# Patient Record
Sex: Male | Born: 1940 | Race: White | Hispanic: No | Marital: Married | Smoking: Never smoker
Health system: Southern US, Community
[De-identification: ages and names within clinical notes are randomized; demographics above are authoritative.]

## PROBLEM LIST (undated history)

## (undated) DIAGNOSIS — N400 Enlarged prostate without lower urinary tract symptoms: Secondary | ICD-10-CM

## (undated) DIAGNOSIS — R0789 Other chest pain: Secondary | ICD-10-CM

## (undated) DIAGNOSIS — I712 Thoracic aortic aneurysm, without rupture: Secondary | ICD-10-CM

## (undated) DIAGNOSIS — E7800 Pure hypercholesterolemia, unspecified: Secondary | ICD-10-CM

## (undated) DIAGNOSIS — I351 Nonrheumatic aortic (valve) insufficiency: Secondary | ICD-10-CM

## (undated) DIAGNOSIS — I10 Essential (primary) hypertension: Secondary | ICD-10-CM

## (undated) DIAGNOSIS — A809 Acute poliomyelitis, unspecified: Secondary | ICD-10-CM

## (undated) HISTORY — DX: Essential (primary) hypertension: I10

## (undated) HISTORY — DX: Acute poliomyelitis, unspecified: A80.9

## (undated) HISTORY — DX: Benign prostatic hyperplasia without lower urinary tract symptoms: N40.0

## (undated) HISTORY — PX: ADENOIDECTOMY: SUR15

## (undated) HISTORY — PX: CATARACT EXTRACTION, BILATERAL: SHX1313

## (undated) HISTORY — DX: Pure hypercholesterolemia, unspecified: E78.00

## (undated) HISTORY — DX: Nonrheumatic aortic (valve) insufficiency: I35.1

## (undated) HISTORY — DX: Thoracic aortic aneurysm, without rupture: I71.2

## (undated) HISTORY — DX: Other chest pain: R07.89

## (undated) HISTORY — PX: TONSILLECTOMY: SUR1361

---

## 1998-12-08 ENCOUNTER — Ambulatory Visit (HOSPITAL_COMMUNITY): Admission: RE | Admit: 1998-12-08 | Discharge: 1998-12-08 | Payer: Self-pay | Admitting: Gastroenterology

## 2002-08-06 ENCOUNTER — Ambulatory Visit (HOSPITAL_BASED_OUTPATIENT_CLINIC_OR_DEPARTMENT_OTHER): Admission: RE | Admit: 2002-08-06 | Discharge: 2002-08-06 | Payer: Self-pay | Admitting: Plastic Surgery

## 2004-08-24 ENCOUNTER — Ambulatory Visit (HOSPITAL_COMMUNITY): Admission: RE | Admit: 2004-08-24 | Discharge: 2004-08-24 | Payer: Self-pay | Admitting: Gastroenterology

## 2010-11-02 ENCOUNTER — Other Ambulatory Visit: Payer: Self-pay | Admitting: *Deleted

## 2010-11-02 NOTE — Telephone Encounter (Signed)
[place]

## 2010-11-03 MED ORDER — CIPROFLOXACIN HCL 500 MG PO TABS: 500.0000 mg | ORAL_TABLET | Freq: Two times a day (BID) | ORAL | Status: AC

## 2010-11-03 MED ORDER — DIPHENOXYLATE-ATROPINE 2.5-0.025 MG PO TABS: 1.0000 | ORAL_TABLET | Freq: Four times a day (QID) | ORAL | Status: AC | PRN

## 2011-09-20 ENCOUNTER — Encounter: Payer: Self-pay | Admitting: *Deleted

## 2012-02-09 ENCOUNTER — Other Ambulatory Visit: Payer: Self-pay | Admitting: Dermatology

## 2012-02-20 ENCOUNTER — Other Ambulatory Visit: Payer: Self-pay | Admitting: *Deleted

## 2012-02-20 MED ORDER — SILDENAFIL CITRATE 100 MG PO TABS
100.0000 mg | ORAL_TABLET | Freq: Every day | ORAL | Status: DC | PRN
Start: 2012-02-20 — End: 2012-11-05

## 2012-02-20 NOTE — Telephone Encounter (Signed)
Refilled viagra

## 2012-07-30 ENCOUNTER — Other Ambulatory Visit: Payer: Self-pay

## 2012-10-31 ENCOUNTER — Ambulatory Visit (INDEPENDENT_AMBULATORY_CARE_PROVIDER_SITE_OTHER): Payer: Commercial Managed Care - PPO | Admitting: Surgery

## 2012-10-31 ENCOUNTER — Encounter (INDEPENDENT_AMBULATORY_CARE_PROVIDER_SITE_OTHER): Payer: Self-pay | Admitting: Surgery

## 2012-10-31 VITALS — BP 136/90 | HR 69 | Temp 97.0°F | Resp 16 | Ht 69.25 in | Wt 165.6 lb

## 2012-10-31 DIAGNOSIS — K4090 Unilateral inguinal hernia, without obstruction or gangrene, not specified as recurrent: Secondary | ICD-10-CM

## 2012-10-31 NOTE — Progress Notes (Addendum)
Re:   Tommy Hubbard DOB:   March 14, 1941 MRN:   [id number]  ASSESSMENT AND PLAN: 1.  Right inguinal hernia, small  I discussed the indications and complications of hernia surgery with the patient.  I discussed both the laparoscopic and open approach to hernia repair..  The potential risks of hernia surgery include, but are not limited to, bleeding, infection, open surgery, nerve injury, and recurrence of the hernia.  I provided the patient literature about hernia surgery.  Because he is thin, has a minimal inguinal hernia, does have some minimal prostate problems, I think that Dr. Patty Sermons would be a good candidate for open repair.  He is comfortable with this and will call back if her changes his mind.  2.  Otherwise healthy  Chief Complaint  Patient presents with  . New Evaluation    eval hernia   REFERRING PHYSICIAN:  Self referred.  HISTORY OF PRESENT ILLNESS: Tommy Hubbard is a 72 y.o. (DOB: February 07, 1941)  white  male whose primary care physician is PERINI,MARK A, MD and comes to me today for possible right inguinal hernia.  He was at a echo course at the [facility], in [place], when he felt a pain in his right groin.  He also noticed a bulge in his right groin. He has had some pain towards his right testicle. The hernia has been easily reducible. He had a colonoscopy last year by Dr. Nadine Counts Buccini which was negative. He has no history of stomach, liver, or colon disease. He's had no prior abdominal surgery.  He has had no prior hernia.    Past Medical History  Diagnosis Date  . Polio     age 72  . BPH (benign prostatic hyperplasia)      History reviewed. No pertinent past surgical history.    Current Outpatient Prescriptions  Medication Sig Dispense Refill  . aspirin 81 MG tablet Take 81 mg by mouth daily.      . Calcium Citrate-Vitamin D 500-500 MG-UNT/5GM POWD Take 500 mg by mouth daily.      . cholecalciferol (VITAMIN D) 400 UNITS TABS Take 1,000 Units by mouth 2  (two) times daily.      . Multiple Vitamin (MULTIVITAMIN) tablet Take 1 tablet by mouth daily.      . vitamin B-12 (CYANOCOBALAMIN) 100 MCG tablet Take 50 mcg by mouth daily.      . sildenafil (VIAGRA) 100 MG tablet Take 1 tablet (100 mg total) by mouth daily as needed for erectile dysfunction.  10 tablet  6   No current facility-administered medications for this visit.     No Known Allergies  REVIEW OF SYSTEMS: Skin:  No history of rash.  No history of abnormal moles. Infection:  No history of hepatitis or HIV.  No history of MRSA. Neurologic:  No history of stroke.  No history of seizure.  Polio at age 55, no residual. Cardiac:  No history of hypertension. No history of heart disease.   Pulmonary:  Does not smoke cigarettes.  No asthma or bronchitis.  No OSA/CPAP.  Endocrine:  No diabetes. No thyroid disease.  His TSH runs a little high, but he is not on replacement. Gastrointestinal:  No history of stomach disease.  No history of liver disease.  No history of gall bladder disease.  No history of pancreas disease.  Colonoscopy by Dr. Clent Ridges - 2013. Urologic:  Mild BPH - on no meds.  Sees Dr. Monico Blitz. Musculoskeletal:  No history of joint or back  disease. Hematologic:  No bleeding disorder.  No history of anemia. Takes a baby aspirin a day. Psycho-social:  The patient is oriented.   The patient has no obvious psychologic or social impairment to understanding our conversation and plan.  SOCIAL and FAMILY HISTORY: Married. His mother lived to be 72 and his father lived to 36. Has 5 grandchildren, ages 69 to 27.  PHYSICAL EXAM: BP 136/90  Pulse 69  Temp(Src) 97 F (36.1 C) (Temporal)  Resp 16  Ht 5' 9.25" (1.759 m)  Wt 165 lb 9.6 oz (75.116 kg)  BMI 24.28 kg/m2  SpO2 97%  General: WN WM who is alert and generally healthy appearing.  HEENT: Normal. Pupils equal. Neck: Supple. No mass.  No thyroid mass. Lymph Nodes:  No supraclavicular or cervical nodes. Lungs: Clear to  auscultation and symmetric breath sounds. Heart:  RRR. No murmur or rub. Abdomen: Soft. No mass. No tenderness. Normal bowel sounds.  No abdominal scars.  In a standing position, he has a small right inguinal hernia.  He may have a weakness on the left side, but I don't think that he has a hernia on that side.  The right inguinal hernia is reducible.  His testicle are normal. Rectal: Not done. Extremities:  Good strength and ROM  in upper and lower extremities. Neurologic:  Grossly intact to motor and sensory function. Psychiatric: Has normal mood and affect. Behavior is normal.   DATA REVIEWED: None. Note, he did have labs on Doctor's Day - but they did not make Epic.  Ovidio Kin, MD,  [facility] [address]  [address]   [place], [place]    [address] Phone:  [phone] FAX:  [phone]

## 2012-11-02 NOTE — Progress Notes (Signed)
Dr Ezzard Standing-  Need PRE OP ORDERS please- has appt 11/08/12 PST  Thanks

## 2012-11-05 ENCOUNTER — Other Ambulatory Visit (INDEPENDENT_AMBULATORY_CARE_PROVIDER_SITE_OTHER): Payer: Self-pay | Admitting: Surgery

## 2012-11-05 ENCOUNTER — Encounter (HOSPITAL_COMMUNITY): Payer: Self-pay | Admitting: Pharmacy Technician

## 2012-11-07 ENCOUNTER — Other Ambulatory Visit (HOSPITAL_COMMUNITY): Payer: Self-pay | Admitting: Surgery

## 2012-11-07 NOTE — Patient Instructions (Addendum)
[address]  11/07/2012   Your procedure is scheduled on: 11-19-2012   MONDAY  Report to [facility] at 1145 AM.  Call this number if you have problems the morning of surgery [phone]   Remember:   Do not eat food :After Midnight. Sunday NIGHT   clear liquids midnight until 815 am day of surgery, then nothing by mouth.   Take these medicines the morning of surgery with A SIP OF WATER:   NONE                                SEE [place] PREPARING FOR SURGERY SHEET   Do not wear jewelry, make-up or nail polish.  Do not wear lotions, powders, or perfumes. You may wear deodorant.   Men may shave face and neck.  Do not bring valuables to the hospital.  Contacts, dentures or bridgework may not be worn into surgery.  Leave suitcase in the car. After surgery it may be brought to your room.  For patients admitted to the hospital, checkout time is 11:00 AM the day of discharge.   Patients discharged the day of surgery will not be allowed to drive home.  Name and phone number of your driver:   WIFE  Special Instructions: N/A   Please read over the following fact sheets that you were given: MRSA Information.  Call Theodis Aguas RN pre op nurse if needed [phone]    FAILURE TO FOLLOW THESE INSTRUCTIONS MAY RESULT IN THE CANCELLATION OF YOUR SURGERY. PATIENT SIGNATURE___________________________________________

## 2012-11-08 ENCOUNTER — Encounter (HOSPITAL_COMMUNITY)
Admission: RE | Admit: 2012-11-08 | Discharge: 2012-11-08 | Disposition: A | Discharge status: Home / Self Care | Payer: 59 | Source: Ambulatory Visit | Attending: Surgery | Admitting: Surgery

## 2012-11-08 ENCOUNTER — Encounter (HOSPITAL_COMMUNITY): Payer: Self-pay

## 2012-11-08 DIAGNOSIS — K4090 Unilateral inguinal hernia, without obstruction or gangrene, not specified as recurrent: Secondary | ICD-10-CM | POA: Insufficient documentation

## 2012-11-08 DIAGNOSIS — Z01812 Encounter for preprocedural laboratory examination: Secondary | ICD-10-CM | POA: Insufficient documentation

## 2012-11-08 LAB — SURGICAL PCR SCREEN
MRSA, PCR: NEGATIVE
Staphylococcus aureus: POSITIVE — AB

## 2012-11-19 ENCOUNTER — Encounter (HOSPITAL_COMMUNITY): Payer: Self-pay | Admitting: Anesthesiology

## 2012-11-19 ENCOUNTER — Ambulatory Visit (HOSPITAL_COMMUNITY)
Admission: RE | Admit: 2012-11-19 | Discharge: 2012-11-19 | Disposition: A | Discharge status: Home / Self Care | Payer: 59 | Source: Ambulatory Visit | Attending: Surgery | Admitting: Surgery

## 2012-11-19 ENCOUNTER — Encounter (HOSPITAL_COMMUNITY): Payer: Self-pay | Admitting: *Deleted

## 2012-11-19 ENCOUNTER — Ambulatory Visit (HOSPITAL_COMMUNITY): Payer: 59 | Admitting: Anesthesiology

## 2012-11-19 ENCOUNTER — Encounter (HOSPITAL_COMMUNITY)
Admission: RE | Disposition: A | Discharge status: Home / Self Care | Payer: Self-pay | Source: Ambulatory Visit | Attending: Surgery

## 2012-11-19 DIAGNOSIS — Z8612 Personal history of poliomyelitis: Secondary | ICD-10-CM | POA: Insufficient documentation

## 2012-11-19 DIAGNOSIS — N400 Enlarged prostate without lower urinary tract symptoms: Secondary | ICD-10-CM | POA: Insufficient documentation

## 2012-11-19 DIAGNOSIS — K4090 Unilateral inguinal hernia, without obstruction or gangrene, not specified as recurrent: Secondary | ICD-10-CM

## 2012-11-19 DIAGNOSIS — Z79899 Other long term (current) drug therapy: Secondary | ICD-10-CM | POA: Insufficient documentation

## 2012-11-19 HISTORY — PX: INGUINAL HERNIA REPAIR: SHX194

## 2012-11-19 HISTORY — PX: INSERTION OF MESH: SHX5868

## 2012-11-19 SURGERY — REPAIR, HERNIA, INGUINAL, ADULT
Anesthesia: General | Site: Groin | Laterality: Right | Wound class: Clean

## 2012-11-19 MED ORDER — ACETAMINOPHEN 10 MG/ML IV SOLN
INTRAVENOUS | Status: AC
  Filled 2012-11-19: qty 100

## 2012-11-19 MED ORDER — LIDOCAINE HCL (CARDIAC) 20 MG/ML IV SOLN
INTRAVENOUS | Status: DC | PRN
  Administered 2012-11-19: 100 mg via INTRAVENOUS

## 2012-11-19 MED ORDER — DEXAMETHASONE SODIUM PHOSPHATE 10 MG/ML IJ SOLN
INTRAMUSCULAR | Status: DC | PRN
  Administered 2012-11-19: 10 mg via INTRAVENOUS

## 2012-11-19 MED ORDER — BUPIVACAINE LIPOSOME 1.3 % IJ SUSP
20.0000 mL | Freq: Once | INTRAMUSCULAR | Status: DC
  Filled 2012-11-19: qty 20

## 2012-11-19 MED ORDER — PROPOFOL 10 MG/ML IV BOLUS
INTRAVENOUS | Status: DC | PRN
  Administered 2012-11-19: 180 mg via INTRAVENOUS

## 2012-11-19 MED ORDER — HYDROCODONE-ACETAMINOPHEN 5-325 MG PO TABS
1.0000 | ORAL_TABLET | Freq: Once | ORAL | Status: AC
  Administered 2012-11-19: 1 via ORAL
  Filled 2012-11-19: qty 1

## 2012-11-19 MED ORDER — BUPIVACAINE HCL (PF) 0.25 % IJ SOLN
INTRAMUSCULAR | Status: AC
  Filled 2012-11-19: qty 30

## 2012-11-19 MED ORDER — PROMETHAZINE HCL 25 MG/ML IJ SOLN: 6.2500 mg | INTRAMUSCULAR | Status: DC | PRN

## 2012-11-19 MED ORDER — LACTATED RINGERS IV SOLN
INTRAVENOUS | Status: DC | PRN
  Administered 2012-11-19: 14:00:00 via INTRAVENOUS

## 2012-11-19 MED ORDER — HYDROMORPHONE HCL PF 1 MG/ML IJ SOLN: 0.2500 mg | INTRAMUSCULAR | Status: DC | PRN

## 2012-11-19 MED ORDER — BUPIVACAINE LIPOSOME 1.3 % IJ SUSP
INTRAMUSCULAR | Status: DC | PRN
  Administered 2012-11-19: 20 mL

## 2012-11-19 MED ORDER — CEFAZOLIN SODIUM-DEXTROSE 2-3 GM-% IV SOLR
2.0000 g | INTRAVENOUS | Status: AC
  Administered 2012-11-19: 2 g via INTRAVENOUS

## 2012-11-19 MED ORDER — 0.9 % SODIUM CHLORIDE (POUR BTL) OPTIME
TOPICAL | Status: DC | PRN
  Administered 2012-11-19: 1000 mL

## 2012-11-19 MED ORDER — FENTANYL CITRATE 0.05 MG/ML IJ SOLN
INTRAMUSCULAR | Status: DC | PRN
  Administered 2012-11-19 (×4): 25 ug via INTRAVENOUS

## 2012-11-19 MED ORDER — ACETAMINOPHEN 10 MG/ML IV SOLN
INTRAVENOUS | Status: DC | PRN
  Administered 2012-11-19: 1000 mg via INTRAVENOUS

## 2012-11-19 MED ORDER — KETOROLAC TROMETHAMINE 30 MG/ML IJ SOLN: 15.0000 mg | Freq: Once | INTRAMUSCULAR | Status: DC | PRN

## 2012-11-19 MED ORDER — HYDROCODONE-ACETAMINOPHEN 5-325 MG PO TABS: 1.0000 | ORAL_TABLET | Freq: Four times a day (QID) | ORAL | Status: DC | PRN

## 2012-11-19 MED ORDER — CEFAZOLIN SODIUM-DEXTROSE 2-3 GM-% IV SOLR
INTRAVENOUS | Status: AC
  Filled 2012-11-19: qty 50

## 2012-11-19 MED ORDER — ONDANSETRON HCL 4 MG/2ML IJ SOLN
INTRAMUSCULAR | Status: DC | PRN
  Administered 2012-11-19: 4 mg via INTRAVENOUS

## 2012-11-19 SURGICAL SUPPLY — 46 items
ADH SKN CLS APL DERMABOND .7 (GAUZE/BANDAGES/DRESSINGS) ×1
APL SKNCLS STERI-STRIP NONHPOA (GAUZE/BANDAGES/DRESSINGS)
BENZOIN TINCTURE PRP APPL 2/3 (GAUZE/BANDAGES/DRESSINGS) ×1 IMPLANT
BLADE HEX COATED 2.75 (ELECTRODE) ×2 IMPLANT
BLADE SURG 15 STRL LF DISP TIS (BLADE) IMPLANT
BLADE SURG 15 STRL SS (BLADE)
BLADE SURG SZ10 CARB STEEL (BLADE) ×4 IMPLANT
CANISTER SUCTION 2500CC (MISCELLANEOUS) ×2 IMPLANT
CLOTH BEACON ORANGE TIMEOUT ST (SAFETY) ×2 IMPLANT
DECANTER SPIKE VIAL GLASS SM (MISCELLANEOUS) ×1 IMPLANT
DERMABOND ADVANCED (GAUZE/BANDAGES/DRESSINGS) ×1
DERMABOND ADVANCED .7 DNX12 (GAUZE/BANDAGES/DRESSINGS) IMPLANT
DISSECTOR ROUND CHERRY 3/8 STR (MISCELLANEOUS) ×1 IMPLANT
DRAIN PENROSE 18X1/2 LTX STRL (DRAIN) ×2 IMPLANT
DRAPE LAPAROTOMY TRNSV 102X78 (DRAPE) ×2 IMPLANT
ELECT REM PT RETURN 9FT ADLT (ELECTROSURGICAL) ×2
ELECTRODE REM PT RTRN 9FT ADLT (ELECTROSURGICAL) ×1 IMPLANT
GLOVE BIOGEL PI IND STRL 7.0 (GLOVE) ×1 IMPLANT
GLOVE BIOGEL PI INDICATOR 7.0 (GLOVE) ×1
GLOVE SURG SIGNA 7.5 PF LTX (GLOVE) ×2 IMPLANT
GOWN STRL NON-REIN LRG LVL3 (GOWN DISPOSABLE) ×1 IMPLANT
GOWN STRL REIN XL XLG (GOWN DISPOSABLE) ×5 IMPLANT
KIT BASIN OR (CUSTOM PROCEDURE TRAY) ×2 IMPLANT
MESH ULTRAPRO 3X6 7.6X15CM (Mesh General) ×1 IMPLANT
NDL HYPO 25X1 1.5 SAFETY (NEEDLE) ×1 IMPLANT
NEEDLE HYPO 25X1 1.5 SAFETY (NEEDLE) ×2 IMPLANT
NS IRRIG 1000ML POUR BTL (IV SOLUTION) ×2 IMPLANT
PACK BASIC VI WITH GOWN DISP (CUSTOM PROCEDURE TRAY) ×2 IMPLANT
PENCIL BUTTON HOLSTER BLD 10FT (ELECTRODE) ×2 IMPLANT
SPONGE GAUZE 4X4 12PLY (GAUZE/BANDAGES/DRESSINGS) ×1 IMPLANT
SPONGE LAP 18X18 X RAY DECT (DISPOSABLE) ×1 IMPLANT
SPONGE LAP 4X18 X RAY DECT (DISPOSABLE) ×1 IMPLANT
STRIP CLOSURE SKIN 1/2X4 (GAUZE/BANDAGES/DRESSINGS) ×1 IMPLANT
SUT CHROMIC 0 SH (SUTURE) IMPLANT
SUT MON AB 5-0 PS2 18 (SUTURE) ×1 IMPLANT
SUT NOVA 0 T19/GS 22DT (SUTURE) ×7 IMPLANT
SUT VIC AB 0 CT2 27 (SUTURE) ×1 IMPLANT
SUT VIC AB 2-0 SH 27 (SUTURE)
SUT VIC AB 2-0 SH 27X BRD (SUTURE) ×1 IMPLANT
SUT VIC AB 3-0 SH 18 (SUTURE) ×2 IMPLANT
SUT VICRYL 2 0 18  UND BR (SUTURE) ×1
SUT VICRYL 2 0 18 UND BR (SUTURE) IMPLANT
SYR BULB IRRIGATION 50ML (SYRINGE) ×2 IMPLANT
SYR CONTROL 10ML LL (SYRINGE) ×2 IMPLANT
TOWEL OR 17X26 10 PK STRL BLUE (TOWEL DISPOSABLE) ×2 IMPLANT
YANKAUER SUCT BULB TIP 10FT TU (MISCELLANEOUS) ×2 IMPLANT

## 2012-11-19 NOTE — Interval H&P Note (Signed)
History and Physical Interval Note:  11/19/2012 2:16 PM  Tommy Hubbard  has presented today for surgery, with the diagnosis of right inguinal hernia  The various methods of treatment have been discussed with the patient and family.  Wife, Windell Moulding, is here.  After consideration of risks, benefits and other options for treatment, the patient has consented to  Procedure(s): HERNIA REPAIR INGUINAL ADULT (Right) as a surgical intervention .    The patient's history has been reviewed, patient examined, no change in status, stable for surgery.  I have reviewed the patient's chart and labs.  Questions were answered to the patient's satisfaction.     Lynesha Bango H

## 2012-11-19 NOTE — Anesthesia Postprocedure Evaluation (Signed)
  Anesthesia Post-op Note  Patient: Tommy Hubbard  Procedure(s) Performed: Procedure(s) (LRB): HERNIA REPAIR INGUINAL ADULT with mesh (Right) INSERTION OF MESH (Right)  Patient Location: PACU  Anesthesia Type: General  Level of Consciousness: awake and alert   Airway and Oxygen Therapy: Patient Spontanous Breathing  Post-op Pain: mild  Post-op Assessment: Post-op Vital signs reviewed, Patient's Cardiovascular Status Stable, Respiratory Function Stable, Patent Airway and No signs of Nausea or vomiting  Last Vitals:  Filed Vitals:   11/19/12 1700  BP: 151/83  Pulse: 64  Temp: 36.5 C  Resp: 16    Post-op Vital Signs: stable   Complications: No apparent anesthesia complications

## 2012-11-19 NOTE — Anesthesia Preprocedure Evaluation (Signed)

## 2012-11-19 NOTE — Preoperative (Signed)
Beta Blockers   Reason not to administer Beta Blockers:Not Applicable 

## 2012-11-19 NOTE — H&P (View-Only) (Signed)
 Re:   Tommy Hubbard DOB:   01/12/1941 MRN:   [id number]  ASSESSMENT AND PLAN: 1.  Right inguinal hernia, small  I discussed the indications and complications of hernia surgery with the patient.  I discussed both the laparoscopic and open approach to hernia repair..  The potential risks of hernia surgery include, but are not limited to, bleeding, infection, open surgery, nerve injury, and recurrence of the hernia.  I provided the patient literature about hernia surgery.  Because he is thin, has a minimal inguinal hernia, does have some minimal prostate problems, I think that Tommy Hubbard would be a good candidate for open repair.  He is comfortable with this and will call back if her changes his mind.  2.  Otherwise healthy  Chief Complaint  Patient presents with  . New Evaluation    eval hernia   REFERRING PHYSICIAN:  Self referred.  HISTORY OF PRESENT ILLNESS: Tommy Hubbard is a 71 y.o. (DOB: 02/11/1941)  white  male whose primary care physician is PERINI,Tommy Hubbard and comes to me today for possible right inguinal hernia.  He was at a echo course at the [facility], in [place], when he felt a pain in his right groin.  He also noticed a bulge in his right groin. He has had some pain towards his right testicle. The hernia has been easily reducible. He had a colonoscopy last year by Dr. Bob Hubbard which was negative. He has no history of stomach, liver, or colon disease. He's had no prior abdominal surgery.  He has had no prior hernia.    Past Medical History  Diagnosis Date  . Polio     age 7  . BPH (benign prostatic hyperplasia)      History reviewed. No pertinent past surgical history.    Current Outpatient Prescriptions  Medication Sig Dispense Refill  . aspirin 81 MG tablet Take 81 mg by mouth daily.      . Calcium Citrate-Vitamin D 500-500 MG-UNT/5GM POWD Take 500 mg by mouth daily.      . cholecalciferol (VITAMIN D) 400 UNITS TABS Take 1,000 Units by mouth 2  (two) times daily.      . Multiple Vitamin (MULTIVITAMIN) tablet Take 1 tablet by mouth daily.      . vitamin B-12 (CYANOCOBALAMIN) 100 MCG tablet Take 50 mcg by mouth daily.      . sildenafil (VIAGRA) 100 MG tablet Take 1 tablet (100 mg total) by mouth daily as needed for erectile dysfunction.  10 tablet  6   No current facility-administered medications for this visit.     No Known Allergies  REVIEW OF SYSTEMS: Skin:  No history of rash.  No history of abnormal moles. Infection:  No history of hepatitis or HIV.  No history of MRSA. Neurologic:  No history of stroke.  No history of seizure.  Polio at age 7, no residual. Cardiac:  No history of hypertension. No history of heart disease.   Pulmonary:  Does not smoke cigarettes.  No asthma or bronchitis.  No OSA/CPAP.  Endocrine:  No diabetes. No thyroid disease.  His TSH runs a little high, but he is not on replacement. Gastrointestinal:  No history of stomach disease.  No history of liver disease.  No history of gall bladder disease.  No history of pancreas disease.  Colonoscopy by Dr. Buccinni - 2013. Urologic:  Mild BPH - on no meds.  Sees Dr. R. Hubbard. Musculoskeletal:  No history of joint or back   disease. Hematologic:  No bleeding disorder.  No history of anemia. Takes a baby aspirin a day. Psycho-social:  The patient is oriented.   The patient has no obvious psychologic or social impairment to understanding our conversation and plan.  SOCIAL and FAMILY HISTORY: Married. His mother lived to be 90 and his father lived to 100. Has 5 grandchildren, ages 8 to 16.  PHYSICAL EXAM: BP 136/90  Pulse 69  Temp(Src) 97 F (36.1 C) (Temporal)  Resp 16  Ht 5' 9.25" (1.759 m)  Wt 165 lb 9.6 oz (75.116 kg)  BMI 24.28 kg/m2  SpO2 97%  General: WN WM who is alert and generally healthy appearing.  HEENT: Normal. Pupils equal. Neck: Supple. No mass.  No thyroid mass. Lymph Nodes:  No supraclavicular or cervical nodes. Lungs: Clear to  auscultation and symmetric breath sounds. Heart:  RRR. No murmur or rub. Abdomen: Soft. No mass. No tenderness. Normal bowel sounds.  No abdominal scars.  In a standing position, he has a small right inguinal hernia.  He may have a weakness on the left side, but I don't think that he has a hernia on that side.  The right inguinal hernia is reducible.  His testicle are normal. Rectal: Not done. Extremities:  Good strength and ROM  in upper and lower extremities. Neurologic:  Grossly intact to motor and sensory function. Psychiatric: Has normal mood and affect. Behavior is normal.   DATA REVIEWED: None. Note, he did have labs on Doctor's Day - but they did not make Epic.  Tommy Chancellor, Hubbard,  [facility] [address]  [address]   [place], [place]    [address] Phone:  [phone] FAX:  [phone]  

## 2012-11-19 NOTE — Transfer of Care (Signed)
Immediate Anesthesia Transfer of Care Note  Patient: Tommy Hubbard  Procedure(s) Performed: Procedure(s): HERNIA REPAIR INGUINAL ADULT with mesh (Right) INSERTION OF MESH (Right)  Patient Location: PACU  Anesthesia Type:General  Level of Consciousness: awake, alert , oriented and patient cooperative  Airway & Oxygen Therapy: Patient Spontanous Breathing and Patient connected to face mask oxygen  Post-op Assessment: Report given to PACU RN and Post -op Vital signs reviewed and stable  Post vital signs: Reviewed and stable  Complications: No apparent anesthesia complications

## 2012-11-19 NOTE — Progress Notes (Signed)
Pt up ambulated down hall to bathroom with standby assistance.  Pt tolerated well.  Pt voided moderated amount of urine.

## 2012-11-19 NOTE — Op Note (Signed)
11/19/2012  4:35 PM  PATIENT:  Tommy Hubbard, 72 y.o., male, MRN: [id number]  PREOP DIAGNOSIS:  right inguinal hernia  POSTOP DIAGNOSIS:   Open right inguinal hernia, direct.  PROCEDURE:   Procedure(s): Right HERNIA REPAIR INGUINAL ADULT with mesh  SURGEON:   Ovidio Kin, M.D.  ANESTHESIA:   general  Anesthesiologist: Eilene Ghazi, MD CRNA: Florene Route, CRNA; Delphia Grates, CRNA  General  EBL:  minimal  ml  LOCAL MEDICATIONS USED:   20 cc Experal  SPECIMEN:   None  COUNTS CORRECT:  YES  INDICATIONS FOR PROCEDURE:  [name] is a 72 y.o. (DOB: 03-05-1941) white  male whose primary care physician is PERINI,MARK A, MD and comes for right inguinal hernia repair.   The indications and risks of the hernia surgery were explained to the patient.  The risks include, but are not limited to, infection, bleeding, recurrence of the hernia, and nerve injury.  Operative Note: The patient was taken to room number 1 at [facility].  He underwent a general anesthesia.    A time out was held and the surgical checklist run.  His lower abdomen was shaved and then prepped with chloroprep.  A right inguinal incision was made through the subcutaneous fat to the external oblique fascia.  The external ring was opened and the cord structures encircled with a penrose drain.  The patient had a direct inguinal hernia (medium in size).  He also had a 2 x 3 cm lipoma of the cord that I removed.  The inguinal floor was repaired with a 3 x 6 inch piece of Ultrapro mesh.  The mesh was cut to fit the inguinal floor.  The mesh was sewn in place with interrupted 0 Novafil suture.  A key hole was made for the internal ring.  The mesh lay flat.  The inguinal floor was covered and the internal ring recreated.  The cord structures were returned to a normal location.  The external oblique was closed with a 3-0 vicryl.  The fascia and subcutaneous tissues were infiltrated with 1/4% marcaine plain.  The skin  was closed with 5-0 monocryl and painted with Dermabond. The sponge and needle count were correct at the end of the case.  The patient was transported to the recovery room in good condition.  The patient will go home today.  Discharge instructions reviewed.  Ovidio Kin, MD, [facility] Pager: [phone] Office phone:  [phone]

## 2012-11-20 ENCOUNTER — Encounter (HOSPITAL_COMMUNITY): Payer: Self-pay | Admitting: Surgery

## 2012-12-20 ENCOUNTER — Ambulatory Visit (INDEPENDENT_AMBULATORY_CARE_PROVIDER_SITE_OTHER): Payer: Commercial Managed Care - PPO | Admitting: Surgery

## 2012-12-20 DIAGNOSIS — K4090 Unilateral inguinal hernia, without obstruction or gangrene, not specified as recurrent: Secondary | ICD-10-CM

## 2012-12-20 NOTE — Progress Notes (Addendum)
   Re:   Tommy Hubbard DOB:   1940/10/12 MRN:   [id number]  ASSESSMENT AND PLAN: 1.  Right inguinal hernia, direct  Open repair with mesh - D. Rusty Glodowski - 11/19/2012  Has done well.  Return appt PRN.  [Tom sent me some pears for Christmas 2014.  DN]  Chief Complaint  Patient presents with  . Routine Post Op   REFERRING PHYSICIAN:  Self referred.  HISTORY OF PRESENT ILLNESS: Tommy Hubbard is Hubbard 72 y.o. (DOB: 03-09-1941)  white  male whose primary care physician is Tommy A, MD and comes to me today for follow up Hubbard an open right inguinal hernia repair.  He has done well. He had very little pain.  We talked about restarting golf, which should be okay.  Past Medical History  Diagnosis Date  . Polio     age 2  . BPH (benign prostatic hyperplasia)      Current Outpatient Prescriptions  Medication Sig Dispense Refill  . aspirin 81 MG tablet Take 81 mg by mouth daily.      Marland Kitchen HYDROcodone-acetaminophen (NORCO/VICODIN) 5-325 MG per tablet Take 1-2 tablets by mouth every 6 (six) hours as needed for pain.  30 tablet  1  . Multiple Vitamin (MULTIVITAMIN) tablet Take 1 tablet by mouth daily.      . sildenafil (VIAGRA) 100 MG tablet Take 100 mg by mouth daily as needed for erectile dysfunction.       No current facility-administered medications for this visit.     No Known Allergies  REVIEW OF SYSTEMS: Neurologic:  No history of stroke.  No history of seizure.  Polio at age 59, no residual. Endocrine:  No diabetes. No thyroid disease.  His TSH runs Hubbard little high, but he is not on replacement. Gastrointestinal:  No history of stomach disease.  No history of liver disease.  No history of gall bladder disease.  No history of pancreas disease.  Colonoscopy by Dr. Clent Hubbard - 2013. Urologic:  Mild BPH - on no meds.  Sees Dr. Monico Hubbard. Hematologic:  No bleeding disorder.  No history of anemia. Takes Hubbard baby aspirin Hubbard day.  SOCIAL and FAMILY HISTORY: Married. His mother lived to be 65  and his father lived to 77. Has 5 grandchildren, ages 22 to 14.  PHYSICAL EXAM: There were no vitals taken for this visit.  General: WN WM who is alert and generally healthy appearing.  Abdomen: Soft. No mass. Right groin incision looks good.  He has some numbness under the incision and Hubbard little twinge of pain towards the right scrotum.  DATA REVIEWED: None new.  Tommy Kin, MD,  [facility] [address]  [address]   [place], [place]    [address] Phone:  [phone] FAX:  [phone]

## 2013-03-18 ENCOUNTER — Other Ambulatory Visit: Payer: Self-pay | Admitting: Dermatology

## 2013-07-25 ENCOUNTER — Encounter (INDEPENDENT_AMBULATORY_CARE_PROVIDER_SITE_OTHER): Payer: Self-pay | Admitting: Surgery

## 2013-08-16 ENCOUNTER — Encounter: Payer: Self-pay | Admitting: Nurse Practitioner

## 2013-08-16 NOTE — Progress Notes (Signed)
Phone call today from Dr. Mare Ferrari - has ongoing [place] - [place] the [place] - wife had been sick with GI bug earlier.   Phenergan 25 mg prn called in for him.   Burtis Junes, RN, [place] [facility] [address] [place], [place]  [address] [phone]

## 2014-07-04 HISTORY — PX: ROTATOR CUFF REPAIR: SHX139

## 2014-08-05 ENCOUNTER — Telehealth: Payer: Self-pay | Admitting: *Deleted

## 2014-08-05 MED ORDER — AZITHROMYCIN 250 MG PO TABS: ORAL_TABLET | ORAL | Status: DC

## 2014-08-05 NOTE — Telephone Encounter (Signed)
Patient c/o sore throat and laryngitis  Discussed with Dr Acie Fredrickson and will Rx Zpak, Rx sent to [facility]

## 2014-12-13 ENCOUNTER — Ambulatory Visit (INDEPENDENT_AMBULATORY_CARE_PROVIDER_SITE_OTHER): Payer: 59 | Admitting: Family Medicine

## 2014-12-13 VITALS — BP 112/70 | HR 77 | Temp 98.0°F | Ht 68.25 in | Wt 165.6 lb

## 2014-12-13 DIAGNOSIS — H6502 Acute serous otitis media, left ear: Secondary | ICD-10-CM | POA: Diagnosis not present

## 2014-12-13 DIAGNOSIS — H9202 Otalgia, left ear: Secondary | ICD-10-CM

## 2014-12-13 MED ORDER — AZITHROMYCIN 250 MG PO TABS: ORAL_TABLET | ORAL | Status: DC

## 2014-12-13 NOTE — Patient Instructions (Signed)
See info on serous otitis below. You can try mucinex or Afrin nasal spray up to 2-3 days only. If increased discomfort, can fill azithromycin for possible early infection. Call me if soreness in canal or outside of ear and I can call in some drops if needed. Enjoy your trip.  Return to the clinic or go to the nearest emergency room if any of your symptoms worsen or new symptoms occur.  Serous Otitis Media Serous otitis media is fluid in the middle ear space. This space contains the bones for hearing and air. Air in the middle ear space helps to transmit sound.  The air gets there through the eustachian tube. This tube goes from the back of the nose (nasopharynx) to the middle ear space. It keeps the pressure in the middle ear the same as the outside world. It also helps to drain fluid from the middle ear space. CAUSES  Serous otitis media occurs when the eustachian tube gets blocked. Blockage can come from:  Ear infections.  Colds and other upper respiratory infections.  Allergies.  Irritants such as cigarette smoke.  Sudden changes in air pressure (such as descending in an airplane).  Enlarged adenoids.  A mass in the nasopharynx. During colds and upper respiratory infections, the middle ear space can become temporarily filled with fluid. This can happen after an ear infection also. Once the infection clears, the fluid will generally drain out of the ear through the eustachian tube. If it does not, then serous otitis media occurs. SIGNS AND SYMPTOMS   Hearing loss.  A feeling of fullness in the ear, without pain.  Young children may not show any symptoms but may show slight behavioral changes, such as agitation, ear pulling, or crying. DIAGNOSIS  Serous otitis media is diagnosed by an ear exam. Tests may be done to check on the movement of the eardrum. Hearing exams may also be done. TREATMENT  The fluid most often goes away without treatment. If allergy is the cause, allergy  treatment may be helpful. Fluid that persists for several months may require minor surgery. A small tube is placed in the eardrum to:  Drain the fluid.  Restore the air in the middle ear space. In certain situations, antibiotic medicines are used to avoid surgery. Surgery may be done to remove enlarged adenoids (if this is the cause). HOME CARE INSTRUCTIONS   Keep children away from tobacco smoke.  Keep all follow-up visits as directed by your health care provider. SEEK MEDICAL CARE IF:   Your hearing is not better in 3 months.  Your hearing is worse.  You have ear pain.  You have drainage from the ear.  You have dizziness.  You have serous otitis media only in one ear or have any bleeding from your nose (epistaxis).  You notice a lump on your neck. MAKE SURE YOU:  Understand these instructions.   Will watch your condition.   Will get help right away if you are not doing well or get worse.  Document Released: 09/10/2003 Document Revised: 11/04/2013 Document Reviewed: 01/15/2013 [facility] Patient Information 2015 [place], [place]. This information is not intended to replace advice given to you by your health care provider. Make sure you discuss any questions you have with your health care provider.

## 2014-12-13 NOTE — Progress Notes (Signed)
Subjective:  This chart was scribed for Tommy Ray, MD by Tommy Hubbard, Medical Scribe. This patient was seen in Room 4 and the patient's care was started at 8:34 AM.   Patient ID: Tommy Hubbard, male    DOB: 1941/06/19, 74 y.o.   MRN: [id number]  Chief Complaint  Patient presents with  . Ear Fullness    Left ear pressure since yesterday    HPI HPI Comments: Tommy Hubbard is a 74 y.o. male who presents to the [facility] complaining of constant, moderate, left ear pressure that began yesterday after swimming. Pt states he is having associated muffled hearing. He reports no problems with his right ear.Pt has not taken anything for his symptoms. Pt denies any recent air travel. He denies any otalgia, tinnitus or upper respiratory symptoms.   PCP: Tommy Ly, MD  Patient Active Problem List   Diagnosis Date Noted  . Inguinal hernia, right 10/31/2012   Past Medical History  Diagnosis Date  . Polio     age 1  . BPH (benign prostatic hyperplasia)    Past Surgical History  Procedure Laterality Date  . Inguinal hernia repair Right 11/19/2012    Procedure: HERNIA REPAIR INGUINAL ADULT with mesh;  Surgeon: Shann Medal, MD;  Location: [facility];  Service: General;  Laterality: Right;  . Insertion of mesh Right 11/19/2012    Procedure: INSERTION OF MESH;  Surgeon: Shann Medal, MD;  Location: [facility];  Service: General;  Laterality: Right;   Allergies  Allergen Reactions  . Tetanus Toxoids Rash    Arthus reaction   Prior to Admission medications   Medication Sig Start Date End Date Taking? Authorizing Provider  aspirin 81 MG tablet Take 81 mg by mouth daily.   Yes Historical Provider, MD  Multiple Vitamin (MULTIVITAMIN) tablet Take 1 tablet by mouth daily.   Yes Historical Provider, MD  sildenafil (VIAGRA) 100 MG tablet Take 100 mg by mouth daily as needed for erectile dysfunction.   Yes Historical Provider, MD  azithromycin (ZITHROMAX) 250 MG  tablet 2 TABLETS TODAY AND THEN ONE DAILY UNTIL FINISHED Patient not taking: Reported on 12/13/2014 08/05/14   Thayer Headings, MD  HYDROcodone-acetaminophen (NORCO/VICODIN) 5-325 MG per tablet Take 1-2 tablets by mouth every 6 (six) hours as needed for pain. Patient not taking: Reported on 12/13/2014 11/19/12   Alphonsa Overall, MD   History   Social History  . Marital Status: Married    Spouse Name: N/A  . Number of Children: N/A  . Years of Education: N/A   Occupational History  . physician    Social History Main Topics  . Smoking status: Never Smoker   . Smokeless tobacco: Never Used  . Alcohol Use: Yes     Comment: 5 glasses of wine per week  . Drug Use: No  . Sexual Activity: Not on file   Other Topics Concern  . Not on file   Social History Narrative    Review of Systems  Constitutional: Negative for fever and chills.  HENT: Positive for hearing loss (muffled hearing, L ear). Negative for congestion, ear discharge, ear pain, postnasal drip, rhinorrhea, sinus pressure, sore throat and tinnitus.        Pressure in left ear  Respiratory: Negative for cough.        Objective:   Physical Exam  Constitutional: He is oriented to person, place, and time. He appears well-developed and well-nourished. No distress.  HENT:  Head: Normocephalic and  atraumatic.  Right ear: Minimal non obstructive cerumen of right canal. TMs pearly grey no erythema or edema Left ear: no lymphadenatopathy, mastoid non tender. Minimal non obstructive cerumen of left canal. Visualized TM appears pearly grey unable to see the usual visual area of TM. No erythema or edema after removal of cerumen with white loop, Left TM appears to have small amount of clear fluid on inferior most TM, and few small bubbles at base of TM at approximately 4:00-5:00  Eyes: Conjunctivae and EOM are normal.  Neck: Neck supple.  Cardiovascular: Normal rate.   Pulmonary/Chest: Effort normal. No respiratory distress.    Lymphadenopathy:    He has no cervical adenopathy.  Neurological: He is alert and oriented to person, place, and time.  Skin: Skin is warm and dry. No rash noted.  Psychiatric: He has a normal mood and affect. His behavior is normal.  Nursing note and vitals reviewed.  Filed Vitals:   12/13/14 0825  BP: 112/70  Pulse: 77  Temp: 98 F (36.7 C)  TempSrc: Oral  Height: 5' 8.25" (1.734 m)  Weight: 165 lb 9.6 oz (75.116 kg)  SpO2: 97%   Assessment & Plan:   . Tommy Hubbard is a 74 y.o. male Otalgia, left - Plan: azithromycin (ZITHROMAX) 250 MG tablet  Acute serous otitis media of left ear, recurrence not specified - Plan: azithromycin (ZITHROMAX) 250 MG tablet  No apparent discolored effusion, or erythema of TM. (visualized after removal of cerumen with curette - no complications.   Suspected serous otitis, less likely early bullous myringitis.  No rash.   - topical decongestant, mucinex trial. Other info per handout. If increased pain - azithro rx given. Can also call if canal or outer ear pain as may need cortisporin otic or gtts. RTC precautions.     Meds ordered this encounter  Medications  . azithromycin (ZITHROMAX) 250 MG tablet    Sig: Take 2 pills by mouth on day 1, then 1 pill by mouth per day on days 2 through 5.    Dispense:  6 tablet    Refill:  0   Patient Instructions  See info on serous otitis below. You can try mucinex or Afrin nasal spray up to 2-3 days only. If increased discomfort, can fill azithromycin for possible early infection. Call me if soreness in canal or outside of ear and I can call in some drops if needed. Enjoy your trip.  Return to the clinic or go to the nearest emergency room if any of your symptoms worsen or new symptoms occur.  Serous Otitis Media Serous otitis media is fluid in the middle ear space. This space contains the bones for hearing and air. Air in the middle ear space helps to transmit sound.  The air gets there through the  eustachian tube. This tube goes from the back of the nose (nasopharynx) to the middle ear space. It keeps the pressure in the middle ear the same as the outside world. It also helps to drain fluid from the middle ear space. CAUSES  Serous otitis media occurs when the eustachian tube gets blocked. Blockage can come from:  Ear infections.  Colds and other upper respiratory infections.  Allergies.  Irritants such as cigarette smoke.  Sudden changes in air pressure (such as descending in an airplane).  Enlarged adenoids.  A mass in the nasopharynx. During colds and upper respiratory infections, the middle ear space can become temporarily filled with fluid. This can happen after an ear infection  also. Once the infection clears, the fluid will generally drain out of the ear through the eustachian tube. If it does not, then serous otitis media occurs. SIGNS AND SYMPTOMS   Hearing loss.  A feeling of fullness in the ear, without pain.  Young children may not show any symptoms but may show slight behavioral changes, such as agitation, ear pulling, or crying. DIAGNOSIS  Serous otitis media is diagnosed by an ear exam. Tests may be done to check on the movement of the eardrum. Hearing exams may also be done. TREATMENT  The fluid most often goes away without treatment. If allergy is the cause, allergy treatment may be helpful. Fluid that persists for several months may require minor surgery. A small tube is placed in the eardrum to:  Drain the fluid.  Restore the air in the middle ear space. In certain situations, antibiotic medicines are used to avoid surgery. Surgery may be done to remove enlarged adenoids (if this is the cause). HOME CARE INSTRUCTIONS   Keep children away from tobacco smoke.  Keep all follow-up visits as directed by your health care provider. SEEK MEDICAL CARE IF:   Your hearing is not better in 3 months.  Your hearing is worse.  You have ear pain.  You have  drainage from the ear.  You have dizziness.  You have serous otitis media only in one ear or have any bleeding from your nose (epistaxis).  You notice a lump on your neck. MAKE SURE YOU:  Understand these instructions.   Will watch your condition.   Will get help right away if you are not doing well or get worse.  Document Released: 09/10/2003 Document Revised: 11/04/2013 Document Reviewed: 01/15/2013 [facility] Patient Information 2015 [place], [place]. This information is not intended to replace advice given to you by your health care provider. Make sure you discuss any questions you have with your health care provider.       I personally performed the services described in this documentation, which was scribed in my presence. The recorded information has been reviewed and considered, and addended by me as needed.

## 2015-05-13 ENCOUNTER — Other Ambulatory Visit: Payer: Self-pay | Admitting: *Deleted

## 2015-05-13 DIAGNOSIS — Z01818 Encounter for other preprocedural examination: Secondary | ICD-10-CM

## 2015-05-21 ENCOUNTER — Telehealth: Payer: Self-pay | Admitting: *Deleted

## 2015-05-21 ENCOUNTER — Other Ambulatory Visit: Payer: Self-pay | Admitting: *Deleted

## 2015-05-21 DIAGNOSIS — H6502 Acute serous otitis media, left ear: Secondary | ICD-10-CM

## 2015-05-21 DIAGNOSIS — H9202 Otalgia, left ear: Secondary | ICD-10-CM

## 2015-05-21 MED ORDER — AZITHROMYCIN 250 MG PO TABS: ORAL_TABLET | ORAL | Status: DC

## 2015-05-21 NOTE — Telephone Encounter (Signed)
Patient complaining of sore throat  Discussed with Dr Acie Fredrickson and he recommends Zpak as directed  Rx sent to pharmacy

## 2015-07-07 DIAGNOSIS — M75101 Unspecified rotator cuff tear or rupture of right shoulder, not specified as traumatic: Secondary | ICD-10-CM | POA: Diagnosis not present

## 2015-07-16 DIAGNOSIS — M75101 Unspecified rotator cuff tear or rupture of right shoulder, not specified as traumatic: Secondary | ICD-10-CM | POA: Diagnosis not present

## 2015-07-21 DIAGNOSIS — M75101 Unspecified rotator cuff tear or rupture of right shoulder, not specified as traumatic: Secondary | ICD-10-CM | POA: Diagnosis not present

## 2015-07-24 DIAGNOSIS — Z85828 Personal history of other malignant neoplasm of skin: Secondary | ICD-10-CM | POA: Diagnosis not present

## 2015-07-24 DIAGNOSIS — D485 Neoplasm of uncertain behavior of skin: Secondary | ICD-10-CM | POA: Diagnosis not present

## 2015-07-24 DIAGNOSIS — L570 Actinic keratosis: Secondary | ICD-10-CM | POA: Diagnosis not present

## 2015-07-24 DIAGNOSIS — C44319 Basal cell carcinoma of skin of other parts of face: Secondary | ICD-10-CM | POA: Diagnosis not present

## 2015-07-28 DIAGNOSIS — M75101 Unspecified rotator cuff tear or rupture of right shoulder, not specified as traumatic: Secondary | ICD-10-CM | POA: Diagnosis not present

## 2015-08-04 DIAGNOSIS — M75101 Unspecified rotator cuff tear or rupture of right shoulder, not specified as traumatic: Secondary | ICD-10-CM | POA: Diagnosis not present

## 2015-08-11 DIAGNOSIS — M75101 Unspecified rotator cuff tear or rupture of right shoulder, not specified as traumatic: Secondary | ICD-10-CM | POA: Diagnosis not present

## 2015-08-18 DIAGNOSIS — M75101 Unspecified rotator cuff tear or rupture of right shoulder, not specified as traumatic: Secondary | ICD-10-CM | POA: Diagnosis not present

## 2015-09-01 DIAGNOSIS — M75101 Unspecified rotator cuff tear or rupture of right shoulder, not specified as traumatic: Secondary | ICD-10-CM | POA: Diagnosis not present

## 2015-09-07 DIAGNOSIS — M75101 Unspecified rotator cuff tear or rupture of right shoulder, not specified as traumatic: Secondary | ICD-10-CM | POA: Diagnosis not present

## 2015-09-14 DIAGNOSIS — Z86010 Personal history of colonic polyps: Secondary | ICD-10-CM | POA: Diagnosis not present

## 2015-09-15 DIAGNOSIS — M75101 Unspecified rotator cuff tear or rupture of right shoulder, not specified as traumatic: Secondary | ICD-10-CM | POA: Diagnosis not present

## 2015-09-15 DIAGNOSIS — Z4789 Encounter for other orthopedic aftercare: Secondary | ICD-10-CM | POA: Diagnosis not present

## 2015-09-16 DIAGNOSIS — L570 Actinic keratosis: Secondary | ICD-10-CM | POA: Diagnosis not present

## 2015-09-16 DIAGNOSIS — L820 Inflamed seborrheic keratosis: Secondary | ICD-10-CM | POA: Diagnosis not present

## 2015-09-29 DIAGNOSIS — M75101 Unspecified rotator cuff tear or rupture of right shoulder, not specified as traumatic: Secondary | ICD-10-CM | POA: Diagnosis not present

## 2015-10-01 DIAGNOSIS — Z961 Presence of intraocular lens: Secondary | ICD-10-CM | POA: Diagnosis not present

## 2015-10-14 DIAGNOSIS — Z4789 Encounter for other orthopedic aftercare: Secondary | ICD-10-CM | POA: Diagnosis not present

## 2015-10-14 DIAGNOSIS — M75121 Complete rotator cuff tear or rupture of right shoulder, not specified as traumatic: Secondary | ICD-10-CM | POA: Diagnosis not present

## 2015-10-23 DIAGNOSIS — M75101 Unspecified rotator cuff tear or rupture of right shoulder, not specified as traumatic: Secondary | ICD-10-CM | POA: Diagnosis not present

## 2015-10-30 DIAGNOSIS — M75101 Unspecified rotator cuff tear or rupture of right shoulder, not specified as traumatic: Secondary | ICD-10-CM | POA: Diagnosis not present

## 2015-11-04 DIAGNOSIS — M75101 Unspecified rotator cuff tear or rupture of right shoulder, not specified as traumatic: Secondary | ICD-10-CM | POA: Diagnosis not present

## 2015-11-13 DIAGNOSIS — M75101 Unspecified rotator cuff tear or rupture of right shoulder, not specified as traumatic: Secondary | ICD-10-CM | POA: Diagnosis not present

## 2015-11-17 DIAGNOSIS — M75101 Unspecified rotator cuff tear or rupture of right shoulder, not specified as traumatic: Secondary | ICD-10-CM | POA: Diagnosis not present

## 2015-11-25 DIAGNOSIS — M25121 Fistula, right elbow: Secondary | ICD-10-CM | POA: Diagnosis not present

## 2015-11-25 DIAGNOSIS — Z4789 Encounter for other orthopedic aftercare: Secondary | ICD-10-CM | POA: Diagnosis not present

## 2016-03-10 DIAGNOSIS — Z0000 Encounter for general adult medical examination without abnormal findings: Secondary | ICD-10-CM | POA: Diagnosis not present

## 2016-03-10 DIAGNOSIS — Z125 Encounter for screening for malignant neoplasm of prostate: Secondary | ICD-10-CM | POA: Diagnosis not present

## 2016-03-14 DIAGNOSIS — R3129 Other microscopic hematuria: Secondary | ICD-10-CM | POA: Diagnosis not present

## 2016-03-14 DIAGNOSIS — C4491 Basal cell carcinoma of skin, unspecified: Secondary | ICD-10-CM | POA: Diagnosis not present

## 2016-03-14 DIAGNOSIS — Z23 Encounter for immunization: Secondary | ICD-10-CM | POA: Diagnosis not present

## 2016-03-14 DIAGNOSIS — Z0000 Encounter for general adult medical examination without abnormal findings: Secondary | ICD-10-CM | POA: Diagnosis not present

## 2016-03-14 DIAGNOSIS — R740 Nonspecific elevation of levels of transaminase and lactic acid dehydrogenase [LDH]: Secondary | ICD-10-CM | POA: Diagnosis not present

## 2016-03-14 DIAGNOSIS — R252 Cramp and spasm: Secondary | ICD-10-CM | POA: Diagnosis not present

## 2016-03-14 DIAGNOSIS — Z1389 Encounter for screening for other disorder: Secondary | ICD-10-CM | POA: Diagnosis not present

## 2016-03-14 DIAGNOSIS — Z6823 Body mass index (BMI) 23.0-23.9, adult: Secondary | ICD-10-CM | POA: Diagnosis not present

## 2016-03-14 DIAGNOSIS — E784 Other hyperlipidemia: Secondary | ICD-10-CM | POA: Diagnosis not present

## 2016-03-14 DIAGNOSIS — N401 Enlarged prostate with lower urinary tract symptoms: Secondary | ICD-10-CM | POA: Diagnosis not present

## 2016-03-14 DIAGNOSIS — D126 Benign neoplasm of colon, unspecified: Secondary | ICD-10-CM | POA: Diagnosis not present

## 2016-03-17 DIAGNOSIS — Z1212 Encounter for screening for malignant neoplasm of rectum: Secondary | ICD-10-CM | POA: Diagnosis not present

## 2016-03-28 DIAGNOSIS — N529 Male erectile dysfunction, unspecified: Secondary | ICD-10-CM | POA: Diagnosis not present

## 2016-03-28 DIAGNOSIS — N401 Enlarged prostate with lower urinary tract symptoms: Secondary | ICD-10-CM | POA: Diagnosis not present

## 2016-06-10 DIAGNOSIS — L819 Disorder of pigmentation, unspecified: Secondary | ICD-10-CM | POA: Diagnosis not present

## 2016-06-10 DIAGNOSIS — L821 Other seborrheic keratosis: Secondary | ICD-10-CM | POA: Diagnosis not present

## 2016-06-10 DIAGNOSIS — D1722 Benign lipomatous neoplasm of skin and subcutaneous tissue of left arm: Secondary | ICD-10-CM | POA: Diagnosis not present

## 2016-06-10 DIAGNOSIS — L814 Other melanin hyperpigmentation: Secondary | ICD-10-CM | POA: Diagnosis not present

## 2016-06-10 DIAGNOSIS — D225 Melanocytic nevi of trunk: Secondary | ICD-10-CM | POA: Diagnosis not present

## 2016-06-10 DIAGNOSIS — D1801 Hemangioma of skin and subcutaneous tissue: Secondary | ICD-10-CM | POA: Diagnosis not present

## 2016-06-10 DIAGNOSIS — D224 Melanocytic nevi of scalp and neck: Secondary | ICD-10-CM | POA: Diagnosis not present

## 2016-06-10 DIAGNOSIS — Z85828 Personal history of other malignant neoplasm of skin: Secondary | ICD-10-CM | POA: Diagnosis not present

## 2016-10-17 DIAGNOSIS — E784 Other hyperlipidemia: Secondary | ICD-10-CM | POA: Diagnosis not present

## 2016-10-17 DIAGNOSIS — Z6822 Body mass index (BMI) 22.0-22.9, adult: Secondary | ICD-10-CM | POA: Diagnosis not present

## 2016-10-17 DIAGNOSIS — E785 Hyperlipidemia, unspecified: Secondary | ICD-10-CM | POA: Diagnosis not present

## 2016-10-17 DIAGNOSIS — R946 Abnormal results of thyroid function studies: Secondary | ICD-10-CM | POA: Diagnosis not present

## 2017-03-27 DIAGNOSIS — M25562 Pain in left knee: Secondary | ICD-10-CM | POA: Diagnosis not present

## 2017-04-01 DIAGNOSIS — Z23 Encounter for immunization: Secondary | ICD-10-CM | POA: Diagnosis not present

## 2017-05-01 DIAGNOSIS — N5202 Corporo-venous occlusive erectile dysfunction: Secondary | ICD-10-CM | POA: Diagnosis not present

## 2017-05-01 DIAGNOSIS — N401 Enlarged prostate with lower urinary tract symptoms: Secondary | ICD-10-CM | POA: Diagnosis not present

## 2017-05-04 DIAGNOSIS — E7849 Other hyperlipidemia: Secondary | ICD-10-CM | POA: Diagnosis not present

## 2017-05-04 DIAGNOSIS — Z125 Encounter for screening for malignant neoplasm of prostate: Secondary | ICD-10-CM | POA: Diagnosis not present

## 2017-05-04 DIAGNOSIS — R82998 Other abnormal findings in urine: Secondary | ICD-10-CM | POA: Diagnosis not present

## 2017-05-08 DIAGNOSIS — R946 Abnormal results of thyroid function studies: Secondary | ICD-10-CM | POA: Diagnosis not present

## 2017-05-08 DIAGNOSIS — D126 Benign neoplasm of colon, unspecified: Secondary | ICD-10-CM | POA: Diagnosis not present

## 2017-05-08 DIAGNOSIS — C4491 Basal cell carcinoma of skin, unspecified: Secondary | ICD-10-CM | POA: Diagnosis not present

## 2017-05-08 DIAGNOSIS — R252 Cramp and spasm: Secondary | ICD-10-CM | POA: Diagnosis not present

## 2017-05-08 DIAGNOSIS — Z1389 Encounter for screening for other disorder: Secondary | ICD-10-CM | POA: Diagnosis not present

## 2017-05-08 DIAGNOSIS — E7849 Other hyperlipidemia: Secondary | ICD-10-CM | POA: Diagnosis not present

## 2017-05-08 DIAGNOSIS — H6122 Impacted cerumen, left ear: Secondary | ICD-10-CM | POA: Diagnosis not present

## 2017-05-08 DIAGNOSIS — Z0000 Encounter for general adult medical examination without abnormal findings: Secondary | ICD-10-CM | POA: Diagnosis not present

## 2017-05-08 DIAGNOSIS — N401 Enlarged prostate with lower urinary tract symptoms: Secondary | ICD-10-CM | POA: Diagnosis not present

## 2017-05-08 DIAGNOSIS — Z6822 Body mass index (BMI) 22.0-22.9, adult: Secondary | ICD-10-CM | POA: Diagnosis not present

## 2017-05-08 DIAGNOSIS — R740 Nonspecific elevation of levels of transaminase and lactic acid dehydrogenase [LDH]: Secondary | ICD-10-CM | POA: Diagnosis not present

## 2017-05-08 DIAGNOSIS — R6889 Other general symptoms and signs: Secondary | ICD-10-CM | POA: Diagnosis not present

## 2017-05-10 DIAGNOSIS — Z1212 Encounter for screening for malignant neoplasm of rectum: Secondary | ICD-10-CM | POA: Diagnosis not present

## 2017-06-16 DIAGNOSIS — D2261 Melanocytic nevi of right upper limb, including shoulder: Secondary | ICD-10-CM | POA: Diagnosis not present

## 2017-06-16 DIAGNOSIS — L819 Disorder of pigmentation, unspecified: Secondary | ICD-10-CM | POA: Diagnosis not present

## 2017-06-16 DIAGNOSIS — L570 Actinic keratosis: Secondary | ICD-10-CM | POA: Diagnosis not present

## 2017-06-16 DIAGNOSIS — D2271 Melanocytic nevi of right lower limb, including hip: Secondary | ICD-10-CM | POA: Diagnosis not present

## 2017-06-16 DIAGNOSIS — L821 Other seborrheic keratosis: Secondary | ICD-10-CM | POA: Diagnosis not present

## 2017-06-16 DIAGNOSIS — D1801 Hemangioma of skin and subcutaneous tissue: Secondary | ICD-10-CM | POA: Diagnosis not present

## 2017-06-16 DIAGNOSIS — D225 Melanocytic nevi of trunk: Secondary | ICD-10-CM | POA: Diagnosis not present

## 2017-06-16 DIAGNOSIS — Z85828 Personal history of other malignant neoplasm of skin: Secondary | ICD-10-CM | POA: Diagnosis not present

## 2017-06-16 DIAGNOSIS — L814 Other melanin hyperpigmentation: Secondary | ICD-10-CM | POA: Diagnosis not present

## 2017-06-16 DIAGNOSIS — D2262 Melanocytic nevi of left upper limb, including shoulder: Secondary | ICD-10-CM | POA: Diagnosis not present

## 2017-07-26 DIAGNOSIS — Z961 Presence of intraocular lens: Secondary | ICD-10-CM | POA: Diagnosis not present

## 2017-12-27 ENCOUNTER — Ambulatory Visit: Payer: 59 | Admitting: Allergy and Immunology

## 2018-01-02 ENCOUNTER — Ambulatory Visit (INDEPENDENT_AMBULATORY_CARE_PROVIDER_SITE_OTHER): Payer: PPO | Admitting: Allergy and Immunology

## 2018-01-02 ENCOUNTER — Encounter: Payer: Self-pay | Admitting: Allergy and Immunology

## 2018-01-02 VITALS — BP 146/82 | HR 70 | Temp 97.9°F | Resp 16 | Ht 68.0 in | Wt 168.0 lb

## 2018-01-02 DIAGNOSIS — K219 Gastro-esophageal reflux disease without esophagitis: Secondary | ICD-10-CM

## 2018-01-02 DIAGNOSIS — J385 Laryngeal spasm: Secondary | ICD-10-CM

## 2018-01-02 MED ORDER — RANITIDINE HCL 300 MG PO CAPS: 300.0000 mg | ORAL_CAPSULE | Freq: Every evening | ORAL | 5 refills | Status: DC

## 2018-01-02 NOTE — Progress Notes (Signed)
Dear Dr. Joylene Draft,  Thank you for referring Tommy Hubbard to the [facility] [place] on 01/02/2018.   Below is a summation of this patient's evaluation and recommendations.  Thank you for your referral. I will keep you informed about this patient's response to treatment.   If you have any questions please do not hesitate to contact me.   Sincerely,  Jiles Prows, MD Allergy / Immunology [place]   ______________________________________________________________________    NEW PATIENT NOTE  Referring Provider: Crist Infante, MD Primary Provider: Crist Infante, MD Date of office visit: 01/02/2018    Subjective:   Chief Complaint:  Tommy Hubbard (DOB: 11-23-1940) is a 77 y.o. male who presents to the clinic on 01/02/2018 with a chief complaint of Inspiratory Stridor .     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  Approximately 4 years ago he had an isolated episode of cough and tickle quickly followed by inspiratory stridor and difficulty breathing that lasted approximately 10 minutes.  He did quite well since that event until April of this year when he once again developed another episode of laryngeal spasm and in May of this year in which he had a 40-minute episode of waxing and waning laryngeal spasm.  At no point in time did he ever suffer from any associated systemic or constitutional symptoms or atopic symptoms.  There was not really an obvious provoking factor giving rise to this issue.  During his last episode in May he did have a mirror examination of his laryngeal area by Dr. Ernesto Rutherford who did not identify any significant abnormality and he was given a "Dosepak" of systemic steroids and a prescription for an EpiPen.  He has very little problems with his throat other than the fact that he will have "mini aspiration" along with  some cough that may last less than a minute that appears to occur infrequently and intermittently over the course of the past several years.  He does have issues with reflux if he eats spicy food.  He does not have a history of chronic burping or belching or regurgitation or epigastric pain.  Past Medical History:  Diagnosis Date  . BPH (benign prostatic hyperplasia)   . Polio    age 38    Past Surgical History:  Procedure Laterality Date  . ADENOIDECTOMY    . CATARACT EXTRACTION, BILATERAL    . INGUINAL HERNIA REPAIR Right 11/19/2012   Procedure: HERNIA REPAIR INGUINAL ADULT with mesh;  Surgeon: Shann Medal, MD;  Location: [facility];  Service: General;  Laterality: Right;  . INSERTION OF MESH Right 11/19/2012   Procedure: INSERTION OF MESH;  Surgeon: Shann Medal, MD;  Location: [facility];  Service: General;  Laterality: Right;  . ROTATOR CUFF REPAIR  2016  . TONSILLECTOMY      Allergies as of 01/02/2018      Reactions   Poison Ivy Extract Rash   Tetanus Toxoids Rash   Arthus reaction      Medication List      aspirin 81 MG tablet Take 81 mg by mouth daily.   EPINEPHrine 0.3 mg/0.3 mL Soaj injection Commonly known as:  EPI-PEN INJECT INTO THE MUSCLE AS NEEDED   multivitamin tablet Take 1 tablet by mouth daily.   rosuvastatin 5 MG tablet Commonly known as:  CRESTOR Take 5 mg by mouth daily.  sildenafil 100 MG tablet Commonly known as:  VIAGRA Take 100 mg by mouth daily as needed for erectile dysfunction.       Review of systems negative except as noted in HPI / PMHx or noted below:  Review of Systems  Constitutional: Negative.   HENT: Negative.   Eyes: Negative.   Respiratory: Negative.   Cardiovascular: Negative.   Gastrointestinal: Negative.   Genitourinary: Negative.   Musculoskeletal: Negative.   Skin: Negative.   Neurological: Negative.   Endo/Heme/Allergies: Negative.   Psychiatric/Behavioral: Negative.     Family History  Problem Relation Age  of Onset  . Cancer Father        bladder  . Cancer Unknown        bladder  . Hypertension Unknown     Social History   Socioeconomic History  . Marital status: Married    Spouse name: Not on file  . Number of children: Not on file  . Years of education: Not on file  . Highest education level: Not on file  Occupational History  . Occupation: physician  Social Needs  . Financial resource strain: Not on file  . Food insecurity:    Worry: Not on file    Inability: Not on file  . Transportation needs:    Medical: Not on file    Non-medical: Not on file  Tobacco Use  . Smoking status: Never Smoker  . Smokeless tobacco: Never Used  Substance and Sexual Activity  . Alcohol use: Yes    Comment: 5 glasses of wine per week  . Drug use: No  . Sexual activity: Not on file  Lifestyle  . Physical activity:    Days per week: Not on file    Minutes per session: Not on file  . Stress: Not on file  Relationships  . Social connections:    Talks on phone: Not on file    Gets together: Not on file    Attends religious service: Not on file    Active member of club or organization: Not on file    Attends meetings of clubs or organizations: Not on file    Relationship status: Not on file  . Intimate partner violence:    Fear of current or ex partner: Not on file    Emotionally abused: Not on file    Physically abused: Not on file    Forced sexual activity: Not on file  Other Topics Concern  . Not on file  Social History Narrative  . Not on file    Environmental and Social history  Lives in a house with a dry environment, no animals located inside the household, carpet in the bedroom, plastic on the bed, plastic on the pillow, and no smoking ongoing with inside the household.  He is a retired Engineer, drilling.  Objective:   Vitals:   01/02/18 1336  BP: (!) 146/82  Pulse: 70  Resp: 16  Temp: 97.9 F (36.6 C)  SpO2: 98%   Height: 5\' 8"  (172.7 cm) Weight: 168 lb (76.2  kg)  Physical Exam  HENT:  Head: Normocephalic.  Right Ear: Tympanic membrane, external ear and ear canal normal.  Left Ear: Tympanic membrane, external ear and ear canal normal.  Nose: Nose normal. No mucosal edema or rhinorrhea.  Mouth/Throat: Uvula is midline, oropharynx is clear and moist and mucous membranes are normal. No oropharyngeal exudate.  Eyes: Conjunctivae are normal.  Neck: Trachea normal. No tracheal tenderness present. No tracheal deviation present. No thyromegaly present.  Cardiovascular: Normal rate, regular rhythm, S1 normal, S2 normal and normal heart sounds.  No murmur heard. Pulmonary/Chest: Breath sounds normal. No stridor. No respiratory distress. He has no wheezes. He has no rales.  Musculoskeletal: He exhibits no edema.  Lymphadenopathy:       Head (right side): No tonsillar adenopathy present.       Head (left side): No tonsillar adenopathy present.    He has no cervical adenopathy.  Neurological: He is alert.  Skin: No rash noted. He is not diaphoretic. No erythema. Nails show no clubbing.    Diagnostics: Allergy skin tests were not performed.   Assessment and Plan:    1. Laryngeal spasm   2. Gastroesophageal reflux disease, esophagitis presence not specified     1.  Obtain modified barium swallow and chest x-ray  2.  Visit with [facility] at [facility]  3.  Treatment for reflux: Ranitidine 300mg  1 time per day  Dr. Mare Ferrari has some form of laryngeal abnormality.  I do not think that this is a edematous reaction and thus I am not really sure that he would benefit from any evaluation for atopic disease or benefit from the use of an EpiPen.  This sounds more like a muscle contraction issue.  The question is why is this occurring and we will obtain a modified barium swallow to look for significant problems with aspiration or defective coordination of swallowing that may suggest a motor neuron disease and we will obtain a chest x-ray to make sure  that his recurrent laryngeal nerve is not being insulted by some mediastinal abnormality.  I think he would be best served by visiting with the [facility] [place] at [facility] for a very complete laryngeal examination.  He does have some issues with reflux and we will cover the possibility that this is a trigger for his laryngeal spasm by having him use an H2 receptor blocker until he can get up to [facility] for his evaluation.  Jiles Prows, MD Allergy / Immunology [place] [facility] [place]

## 2018-01-02 NOTE — Patient Instructions (Addendum)
  1.  Obtain modified barium swallow and chest x-ray  2.  Visit with [facility] at [facility]  3.  Treatment for reflux: Ranitidine 300mg  1 time per day

## 2018-01-03 ENCOUNTER — Encounter: Payer: Self-pay | Admitting: Allergy and Immunology

## 2018-01-05 ENCOUNTER — Other Ambulatory Visit (HOSPITAL_COMMUNITY): Payer: Self-pay | Admitting: Allergy and Immunology

## 2018-01-05 DIAGNOSIS — R1310 Dysphagia, unspecified: Secondary | ICD-10-CM

## 2018-01-10 ENCOUNTER — Telehealth: Payer: Self-pay

## 2018-01-10 NOTE — Telephone Encounter (Signed)
-----   Message from Fernand Parkins sent at 01/03/2018  7:32 AM EDT ----- Regarding: FW: Referral   ----- Message ----- From: Lucrezia Starch I, CMA Sent: 01/02/2018   2:33 PM To: Fernand Parkins Subject: Referral                                       Dr. Neldon Mc would like patient referred to [facility] at [facility]. DX laryngeal spasm.

## 2018-01-10 NOTE — Telephone Encounter (Signed)
Patient is scheduled for August 7 at 1:20 with Dr Joya Gaskins Laryngologist and also Amy Morris Speech Pathologist at 2:00. [place] [phone]. [phone]. I have left a voicemail for the patient with the details. If patient calls back with any questions please give him this info. Referral notes faxed to [place].

## 2018-01-12 NOTE — Telephone Encounter (Signed)
In the event patient does call, please inform of the following attached information.

## 2018-01-12 NOTE — Telephone Encounter (Signed)
Called patient from [place] office. Someone picked up then lost connection.

## 2018-01-16 NOTE — Telephone Encounter (Signed)
Called patient. Sent directly to voicemail. I am sending letter for patient with appointment details.

## 2018-01-18 ENCOUNTER — Ambulatory Visit (HOSPITAL_COMMUNITY)
Admission: RE | Admit: 2018-01-18 | Discharge: 2018-01-18 | Disposition: A | Discharge status: Home / Self Care | Payer: PPO | Source: Ambulatory Visit | Attending: Allergy and Immunology | Admitting: Allergy and Immunology

## 2018-01-18 DIAGNOSIS — R1310 Dysphagia, unspecified: Secondary | ICD-10-CM

## 2018-01-18 DIAGNOSIS — J385 Laryngeal spasm: Secondary | ICD-10-CM

## 2018-01-18 DIAGNOSIS — K219 Gastro-esophageal reflux disease without esophagitis: Secondary | ICD-10-CM | POA: Diagnosis not present

## 2018-01-18 DIAGNOSIS — I700 Atherosclerosis of aorta: Secondary | ICD-10-CM | POA: Insufficient documentation

## 2018-01-18 DIAGNOSIS — Z9842 Cataract extraction status, left eye: Secondary | ICD-10-CM | POA: Insufficient documentation

## 2018-01-18 DIAGNOSIS — N400 Enlarged prostate without lower urinary tract symptoms: Secondary | ICD-10-CM | POA: Diagnosis not present

## 2018-01-18 DIAGNOSIS — K209 Esophagitis, unspecified: Secondary | ICD-10-CM | POA: Diagnosis not present

## 2018-01-18 NOTE — Progress Notes (Signed)
Modified Barium Swallow Progress Note  Patient Details  Name: Tommy Hubbard MRN: [id number] Date of Birth: May 07, 1941  Today's Date: 01/18/2018  Modified Barium Swallow completed.  Full report located under Chart Review in the Imaging Section.  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 There is mild entrapment of thin liquid in the vallecular space (curled epiglottis) that spills to pyriforms post swallow and results in trace penetration post swallow that accumulates to lead to trace aspiration just below the cords without pt response (more significant aspiration during a chin tuck was sensed). Pt does initate a second swallow spontaneously which clears most residue. A cued throat clear fully ejects penetrate/aspirate and is a recommended strategy as effortful swallow and chin tuck were not beneficial. 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 No finding of significant hyolaryngeal weakness to explain mild dysphagia though suspicion of possible decreased laryngeal closure present. Would recommend f/u with Voice/swallowing center to address laryngospasm with possible interventions and for direct visual assessment of laryngeal function/anatomy.    Swallow Evaluation Recommendations       SLP Diet Recommendations: Regular solids;Thin liquid   Liquid Administration via: Cup;Straw   Medication Administration: Whole meds with liquid   Supervision: Patient able to self feed   Compensations: Clear throat intermittently   Postural Changes: Seated upright at 90 degrees          [facility], MA CCC-SLP [phone]   Lynann Beaver 01/18/2018,1:23  PM

## 2018-02-07 DIAGNOSIS — J383 Other diseases of vocal cords: Secondary | ICD-10-CM | POA: Diagnosis not present

## 2018-02-07 DIAGNOSIS — J3800 Paralysis of vocal cords and larynx, unspecified: Secondary | ICD-10-CM | POA: Diagnosis not present

## 2018-02-07 DIAGNOSIS — R0600 Dyspnea, unspecified: Secondary | ICD-10-CM | POA: Diagnosis not present

## 2018-02-07 DIAGNOSIS — J3801 Paralysis of vocal cords and larynx, unilateral: Secondary | ICD-10-CM | POA: Diagnosis not present

## 2018-02-07 DIAGNOSIS — R490 Dysphonia: Secondary | ICD-10-CM | POA: Diagnosis not present

## 2018-02-07 DIAGNOSIS — J385 Laryngeal spasm: Secondary | ICD-10-CM | POA: Diagnosis not present

## 2018-02-21 DIAGNOSIS — Z961 Presence of intraocular lens: Secondary | ICD-10-CM | POA: Diagnosis not present

## 2018-02-21 DIAGNOSIS — H26493 Other secondary cataract, bilateral: Secondary | ICD-10-CM | POA: Diagnosis not present

## 2018-03-22 DIAGNOSIS — J3801 Paralysis of vocal cords and larynx, unilateral: Secondary | ICD-10-CM | POA: Diagnosis not present

## 2018-03-31 DIAGNOSIS — Z23 Encounter for immunization: Secondary | ICD-10-CM | POA: Diagnosis not present

## 2018-05-28 DIAGNOSIS — R82998 Other abnormal findings in urine: Secondary | ICD-10-CM | POA: Diagnosis not present

## 2018-05-28 DIAGNOSIS — Z125 Encounter for screening for malignant neoplasm of prostate: Secondary | ICD-10-CM | POA: Diagnosis not present

## 2018-05-28 DIAGNOSIS — E7849 Other hyperlipidemia: Secondary | ICD-10-CM | POA: Diagnosis not present

## 2018-06-04 ENCOUNTER — Other Ambulatory Visit: Payer: Self-pay

## 2018-06-04 ENCOUNTER — Encounter: Payer: Self-pay | Admitting: Internal Medicine

## 2018-06-04 ENCOUNTER — Ambulatory Visit (HOSPITAL_COMMUNITY): Payer: PPO | Attending: Cardiovascular Disease

## 2018-06-04 ENCOUNTER — Other Ambulatory Visit (HOSPITAL_COMMUNITY): Payer: Self-pay | Admitting: Internal Medicine

## 2018-06-04 DIAGNOSIS — R011 Cardiac murmur, unspecified: Secondary | ICD-10-CM

## 2018-06-04 DIAGNOSIS — D126 Benign neoplasm of colon, unspecified: Secondary | ICD-10-CM | POA: Diagnosis not present

## 2018-06-04 DIAGNOSIS — N401 Enlarged prostate with lower urinary tract symptoms: Secondary | ICD-10-CM | POA: Diagnosis not present

## 2018-06-04 DIAGNOSIS — R946 Abnormal results of thyroid function studies: Secondary | ICD-10-CM | POA: Diagnosis not present

## 2018-06-04 DIAGNOSIS — R740 Nonspecific elevation of levels of transaminase and lactic acid dehydrogenase [LDH]: Secondary | ICD-10-CM | POA: Diagnosis not present

## 2018-06-04 DIAGNOSIS — R252 Cramp and spasm: Secondary | ICD-10-CM | POA: Diagnosis not present

## 2018-06-04 DIAGNOSIS — Z0000 Encounter for general adult medical examination without abnormal findings: Secondary | ICD-10-CM | POA: Diagnosis not present

## 2018-06-04 DIAGNOSIS — R030 Elevated blood-pressure reading, without diagnosis of hypertension: Secondary | ICD-10-CM | POA: Diagnosis not present

## 2018-06-04 DIAGNOSIS — I7090 Unspecified atherosclerosis: Secondary | ICD-10-CM | POA: Diagnosis not present

## 2018-06-04 DIAGNOSIS — C4491 Basal cell carcinoma of skin, unspecified: Secondary | ICD-10-CM | POA: Diagnosis not present

## 2018-06-04 DIAGNOSIS — J3800 Paralysis of vocal cords and larynx, unspecified: Secondary | ICD-10-CM | POA: Diagnosis not present

## 2018-06-04 DIAGNOSIS — E7849 Other hyperlipidemia: Secondary | ICD-10-CM | POA: Diagnosis not present

## 2018-06-11 DIAGNOSIS — N138 Other obstructive and reflux uropathy: Secondary | ICD-10-CM | POA: Diagnosis not present

## 2018-06-11 DIAGNOSIS — N401 Enlarged prostate with lower urinary tract symptoms: Secondary | ICD-10-CM | POA: Diagnosis not present

## 2018-06-11 DIAGNOSIS — N528 Other male erectile dysfunction: Secondary | ICD-10-CM | POA: Diagnosis not present

## 2018-07-19 DIAGNOSIS — R030 Elevated blood-pressure reading, without diagnosis of hypertension: Secondary | ICD-10-CM | POA: Diagnosis not present

## 2018-07-23 DIAGNOSIS — R030 Elevated blood-pressure reading, without diagnosis of hypertension: Secondary | ICD-10-CM | POA: Diagnosis not present

## 2018-08-06 ENCOUNTER — Encounter: Payer: Self-pay | Admitting: Cardiovascular Disease

## 2018-08-06 ENCOUNTER — Encounter (INDEPENDENT_AMBULATORY_CARE_PROVIDER_SITE_OTHER): Payer: Self-pay

## 2018-08-06 ENCOUNTER — Ambulatory Visit (INDEPENDENT_AMBULATORY_CARE_PROVIDER_SITE_OTHER): Payer: PPO | Admitting: Cardiovascular Disease

## 2018-08-06 VITALS — BP 152/80 | HR 70 | Ht 69.0 in | Wt 159.4 lb

## 2018-08-06 DIAGNOSIS — E7800 Pure hypercholesterolemia, unspecified: Secondary | ICD-10-CM

## 2018-08-06 DIAGNOSIS — I351 Nonrheumatic aortic (valve) insufficiency: Secondary | ICD-10-CM

## 2018-08-06 DIAGNOSIS — I10 Essential (primary) hypertension: Secondary | ICD-10-CM

## 2018-08-06 DIAGNOSIS — Z5181 Encounter for therapeutic drug level monitoring: Secondary | ICD-10-CM

## 2018-08-06 DIAGNOSIS — R072 Precordial pain: Secondary | ICD-10-CM | POA: Diagnosis not present

## 2018-08-06 DIAGNOSIS — R0789 Other chest pain: Secondary | ICD-10-CM | POA: Diagnosis not present

## 2018-08-06 DIAGNOSIS — I712 Thoracic aortic aneurysm, without rupture: Secondary | ICD-10-CM

## 2018-08-06 DIAGNOSIS — I7121 Aneurysm of the ascending aorta, without rupture: Secondary | ICD-10-CM | POA: Insufficient documentation

## 2018-08-06 HISTORY — DX: Nonrheumatic aortic (valve) insufficiency: I35.1

## 2018-08-06 HISTORY — DX: Pure hypercholesterolemia, unspecified: E78.00

## 2018-08-06 HISTORY — DX: Thoracic aortic aneurysm, without rupture: I71.2

## 2018-08-06 HISTORY — DX: Other chest pain: R07.89

## 2018-08-06 HISTORY — DX: Aneurysm of the ascending aorta, without rupture: I71.21

## 2018-08-06 HISTORY — DX: Essential (primary) hypertension: I10

## 2018-08-06 MED ORDER — METOPROLOL TARTRATE 50 MG PO TABS: ORAL_TABLET | ORAL | 0 refills | Status: DC

## 2018-08-06 NOTE — Patient Instructions (Signed)
Medication Instructions:  TAKE METOPROLOL 50 MG 2 HOURS PRIOR TO CARDIAC CT   If you need a refill on your cardiac medications before your next appointment, please call your pharmacy.   Lab work: BMET TODAY   If you have labs (blood work) drawn today and your tests are completely normal, you will receive your results only by: Marland Kitchen MyChart Message (if you have MyChart) OR . A paper copy in the mail If you have any lab test that is abnormal or we need to change your treatment, we will call you to review the results.  Testing/Procedures: Your physician has requested that you have cardiac CT. Cardiac computed tomography (CT) is a painless test that uses an x-ray machine to take clear, detailed pictures of your heart. For further information please visit [facility]uge[employer]tn. Please follow instruction sheet as given. THE OFFICE WILL CALL YOU TO SCHEDULE ONCE YOUR INSURANCE HAS APPROVED   Your physician has requested that you have an echocardiogram. Echocardiography is a painless test that uses sound waves to create images of your heart. It provides your doctor with information about the size and shape of your heart and how well your heart's chambers and valves are working. This procedure takes approximately one hour. There are no restrictions for this procedure. AFTER 06/05/19  Follow-Up: At [facility], you and your health needs are our priority.  As part of our continuing mission to provide you with exceptional heart care, we have created designated Provider Care Teams.  These Care Teams include your primary Cardiologist (physician) and Advanced Practice Providers (APPs -  Physician Assistants and Nurse Practitioners) who all work together to provide you with the care you need, when you need it. You will need a follow up appointment in AFTER ECHO Please call our office 2 months in advance to schedule this appointment.  You may see DR Steward [facility] or one of the following Advanced Practice Providers on  your designated Care Team:   Kerin Ransom, PA-C Roby Lofts, [place] . Sande Rives, PA-C  Any Other Special Instructions Will Be Listed Below (If Applicable).  Please arrive at the [facility] main entrance of [facility] at xx:xx AM (30-45 minutes prior to test start time)  [facility] [place], [place] [address] [phone]  Proceed to the [facility] Radiology Department (First Floor).  Please follow these instructions carefully (unless otherwise directed):  Hold all erectile dysfunction medications at least 48 hours prior to test.  On the Night Before the Test: . Be sure to Drink plenty of water. . Do not consume any caffeinated/decaffeinated beverages or chocolate 12 hours prior to your test. . Do not take any antihistamines 12 hours prior to your test.  On the Day of the Test: . Drink plenty of water. Do not drink any water within one hour of the test. . Do not eat any food 4 hours prior to the test. . You may take your regular medications prior to the test.  . Take metoprolol (Lopressor) two hours prior to test.      After the Test: . Drink plenty of water. . After receiving IV contrast, you may experience a mild flushed feeling. This is normal. . On occasion, you may experience a mild rash up to 24 hours after the test. This is not dangerous. If this occurs, you can take Benadryl 25 mg and increase your fluid intake. . If you experience trouble breathing, this can be serious. If it is severe call  [phone] IMMEDIATELY. If it is mild, please call our office. . If you take any of these medications: Glipizide/Metformin, Avandament, Glucavance, please do not take 48 hours after completing test.

## 2018-08-06 NOTE — Progress Notes (Addendum)
Cardiology Office Note   Date:  08/06/2018   ID:  Tommy Hubbard, DOB 01-25-1941, MRN [id number]  PCP:  Crist Infante, MD  Cardiologist:   Skeet Latch, MD   No chief complaint on file.     History of Present Illness: Tommy Hubbard is a 78 y.o. male with hypertension, mild-moderate aortic regurgitation, and hyperlipidemia ho is being seen today for the evaluation of chest discomfort at the request of Crist Infante, MD.  Dr. Mare Ferrari notes that for the last couple months he has experienced intermittent chest discomfort.  He exercises by playing tennis twice per week.  Lately he notes that he is slightly more short of breath with exertion.  He also has occasional episodes of chest tightness.  This lasts for a few minutes at a time and are not associated with any nausea or diaphoresis.  He notes it is worse when he first starts exercising and seems to improve as he continues to exercise.  He has also had some intermittent episodes of chest tightness that occur when walking up hills or when belching.  It sometimes feels like heartburn.  He has not experienced any lower extremity edema, orthopnea, or PND.  He saw his PCP, Dr. Joylene Draft and his blood pressure was noted to be elevated.  He was started on valsartan.  After starting this medication he wore an ambulatory monitor that showed his blood pressure was 118/65 on average.  Dr. Joylene Draft noted a murmur on exam.  He had an echo 06/2018 that revealed LVEF 55 to 60% with grade 1 diastolic dysfunction.  He had mild calcification of the aortic valve with mild to moderate regurgitation.  The pressure half-time was 426 ms.  The ascending aorta was mildly dilated at 3.8 m.  Echo was otherwise unremarkable.  He previously had an ETT 09/2008 for ischemia.  He was started on rosuvastatin approximately 6 months ago and has tolerated this well.   Past Medical History:  Diagnosis Date  . Aneurysm of ascending aorta (HCC) 08/06/2018   Mild 06/2018 on echo.  .  Aortic regurgitation 08/06/2018   Mild to moderate 06/2018  . Atypical chest pain 08/06/2018  . BPH (benign prostatic hyperplasia)   . Essential hypertension 08/06/2018  . 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 Pure hypercholesterolemia 08/06/2018    Past Surgical History:  Procedure Laterality Date  . ADENOIDECTOMY    . CATARACT EXTRACTION, BILATERAL    . INGUINAL HERNIA REPAIR Right 11/19/2012   Procedure: HERNIA REPAIR INGUINAL ADULT with mesh;  Surgeon: Shann Medal, MD;  Location: [facility];  Service: General;  Laterality: Right;  . INSERTION OF MESH Right 11/19/2012   Procedure: INSERTION OF MESH;  Surgeon: Shann Medal, MD;  Location: [facility];  Service: General;  Laterality: Right;  . ROTATOR CUFF REPAIR  2016  . TONSILLECTOMY       Current Outpatient Medications  Medication Sig Dispense Refill  . B Complex Vitamins (VITAMIN B-COMPLEX) TABS Take by mouth.    . Cholecalciferol (VITAMIN D3) 50 MCG (2000 UT) capsule Take by mouth.    . Multiple Vitamins-Minerals (MULTIVITAMIN WITH MINERALS) tablet Take by mouth.    . rosuvastatin (CRESTOR) 5 MG tablet Take 5 mg by mouth daily.  2  . sildenafil (VIAGRA) 100 MG tablet Take 40 mg by mouth daily as needed for erectile dysfunction.     . valsartan (DIOVAN) 40 MG tablet     . metoprolol tartrate (LOPRESSOR) 50 MG tablet TAKE 1  TABLET BY MOUTH 2 HOURS PRIOR TO CT 1 tablet 0   No current facility-administered medications for this visit.     Allergies:   Poison ivy extract and Tetanus toxoids    Social History:  The patient  reports that he has never smoked. He has never used smokeless tobacco. He reports current alcohol use. He reports that he does not use drugs.   Family History:  The patient's family history includes Aortic stenosis in his father; Atrial fibrillation in his father; Breast cancer in his maternal grandmother; Cancer in his father and another family member; Hypertension in an other family member.    ROS:  Please see the history of present  illness.   Otherwise, review of systems are positive for none.   All other systems are reviewed and negative.    PHYSICAL EXAM: VS:  BP (!) 152/80   Pulse 70   Ht 5\' 9"  (1.753 m)   Wt 159 lb 6.4 oz (72.3 kg)   BMI 23.54 kg/m  , BMI Body mass index is 23.54 kg/m. GENERAL:  Well appearing HEENT:  Pupils equal round and reactive, fundi not visualized, oral mucosa unremarkable NECK:  No jugular venous distention, waveform within normal limits, carotid upstroke brisk and symmetric, no bruits LUNGS:  Clear to auscultation bilaterally HEART:  RRR.  PMI not displaced or sustained,S1 and S2 within normal limits, no S3, no S4, no clicks, no rubs, no murmurs ABD:  Flat, positive bowel sounds normal in frequency in pitch, no bruits, no rebound, no guarding, no midline pulsatile mass, no hepatomegaly, no splenomegaly EXT:  2 plus pulses throughout, no edema, no cyanosis no clubbing SKIN:  No rashes no nodules NEURO:  Cranial nerves II through XII grossly intact, motor grossly intact throughout PSYCH:  Cognitively intact, oriented to person place and time   EKG:  EKG is ordered today. The ekg ordered today demonstrates sinus rhythm.  Rate 76 bpm.  LAFB.  Non-specific IVCD.     Echo 06/04/18: Study Conclusions  - Left ventricle: The cavity size was normal. Wall thickness was   increased in a pattern of mild LVH. Systolic function was normal.   The estimated ejection fraction was in the range of 55% to 60%.   Wall motion was normal; there were no regional wall motion   abnormalities. Doppler parameters are consistent with abnormal   left ventricular relaxation (grade 1 diastolic dysfunction). - Aortic valve: Trileaflet; mildly calcified leaflets. There was no   stenosis. There was mild to moderate regurgitation. - Ascending aorta: The ascending aorta was mildly dilated at 38 mm. - Mitral valve: Mildly calcified annulus. - Right ventricle: The cavity size was normal. Systolic function   was  normal. - Pulmonary arteries: No complete TR doppler jet so unable to   estimate PA systolic pressure. - Inferior vena cava: The vessel was normal in size. The   respirophasic diameter changes were in the normal range (= 50%),   consistent with normal central venous pressure.  Impressions:  - Normal LV size with mild LV hypertrophy. EF 55-60%. Normal RV   size and systolic function. Mild to moderate aortic   insufficiency.   Recent Labs: No results found for requested labs within last 8760 hours.    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  He does have exertional chest discomfort but it seems to improve the more exercise.  It also occurs with belching.  We will get a coronary CT-A to better assess.    # Hypertension: BP is elevated but was very well-controlled on ambulatory monitor.  Continue valsartan.  We will check a BMP.  # Hyperlipidemia: LDL was 70 on 05/2018.  Continue rosuvastatin.  # Mild-moderate aortic regurgitation: # Ascending aorta aneurysm: Ascending aorta was 3.8 cm on 06/2018.  Mild-moderate AR.  Repeat echo in 1 year.   Current medicines are reviewed at length with the patient today.  The patient does not have concerns regarding medicines.  The following changes have been made:  no change  Labs/ tests ordered today include:   Orders Placed This Encounter  Procedures  . CT CORONARY MORPH W/CTA COR W/SCORE W/CA W/CM &/OR WO/CM  . CT CORONARY FRACTIONAL FLOW RESERVE DATA PREP  . CT CORONARY FRACTIONAL FLOW RESERVE FLUID ANALYSIS  . Basic metabolic panel  . EKG 12-Lead  . ECHOCARDIOGRAM COMPLETE     Disposition:   FU with Ramiya Delahunty C. Oval Linsey, MD, [facility] in 1 year.       Signed, Parul Porcelli C. Oval Linsey, MD, [facility]  08/06/2018 4:25 PM    [place]

## 2018-08-07 LAB — BASIC METABOLIC PANEL
BUN/Creatinine Ratio: 21 (ref 10–24)
BUN: 20 mg/dL (ref 8–27)
CALCIUM: 9.8 mg/dL (ref 8.6–10.2)
CO2: 23 mmol/L (ref 20–29)
CREATININE: 0.96 mg/dL (ref 0.76–1.27)
Chloride: 105 mmol/L (ref 96–106)
GFR calc Af Amer: 88 mL/min/{1.73_m2} (ref >59)
GFR calc non Af Amer: 76 mL/min/{1.73_m2} (ref >59)
GLUCOSE: 88 mg/dL (ref 65–99)
Potassium: 4.4 mmol/L (ref 3.5–5.2)
Sodium: 143 mmol/L (ref 134–144)

## 2018-08-08 ENCOUNTER — Encounter (HOSPITAL_COMMUNITY): Payer: Self-pay | Admitting: Cardiovascular Disease

## 2018-08-08 DIAGNOSIS — D2272 Melanocytic nevi of left lower limb, including hip: Secondary | ICD-10-CM | POA: Diagnosis not present

## 2018-08-08 DIAGNOSIS — D2262 Melanocytic nevi of left upper limb, including shoulder: Secondary | ICD-10-CM | POA: Diagnosis not present

## 2018-08-08 DIAGNOSIS — D224 Melanocytic nevi of scalp and neck: Secondary | ICD-10-CM | POA: Diagnosis not present

## 2018-08-08 DIAGNOSIS — L570 Actinic keratosis: Secondary | ICD-10-CM | POA: Diagnosis not present

## 2018-08-08 DIAGNOSIS — D225 Melanocytic nevi of trunk: Secondary | ICD-10-CM | POA: Diagnosis not present

## 2018-08-08 DIAGNOSIS — D1801 Hemangioma of skin and subcutaneous tissue: Secondary | ICD-10-CM | POA: Diagnosis not present

## 2018-08-08 DIAGNOSIS — D2271 Melanocytic nevi of right lower limb, including hip: Secondary | ICD-10-CM | POA: Diagnosis not present

## 2018-08-08 DIAGNOSIS — L821 Other seborrheic keratosis: Secondary | ICD-10-CM | POA: Diagnosis not present

## 2018-08-08 DIAGNOSIS — Z85828 Personal history of other malignant neoplasm of skin: Secondary | ICD-10-CM | POA: Diagnosis not present

## 2018-08-08 DIAGNOSIS — L814 Other melanin hyperpigmentation: Secondary | ICD-10-CM | POA: Diagnosis not present

## 2018-08-14 ENCOUNTER — Telehealth (HOSPITAL_COMMUNITY): Payer: Self-pay | Admitting: Emergency Medicine

## 2018-08-14 NOTE — Telephone Encounter (Signed)
Reaching out to patient to offer assistance regarding upcoming cardiac imaging study; pt verbalizes understanding of appt date/time, parking situation and where to check in, pre-test NPO status and medications ordered, and verified current allergies; name and call back number provided for further questions should they arise Sherrelle Prochazka RN Navigator Cardiac Imaging [facility] [phone] office [phone] cell 

## 2018-08-16 ENCOUNTER — Ambulatory Visit (HOSPITAL_COMMUNITY): Admission: RE | Admit: 2018-08-16 | Payer: PPO | Source: Ambulatory Visit

## 2018-08-16 ENCOUNTER — Ambulatory Visit (HOSPITAL_COMMUNITY)
Admission: RE | Admit: 2018-08-16 | Discharge: 2018-08-16 | Disposition: A | Discharge status: Home / Self Care | Payer: PPO | Source: Ambulatory Visit | Attending: Cardiovascular Disease | Admitting: Cardiovascular Disease

## 2018-08-16 DIAGNOSIS — R072 Precordial pain: Secondary | ICD-10-CM | POA: Diagnosis not present

## 2018-08-16 MED ORDER — NITROGLYCERIN 0.4 MG SL SUBL
0.8000 mg | SUBLINGUAL_TABLET | Freq: Once | SUBLINGUAL | Status: AC
  Administered 2018-08-16: 0.8 mg via SUBLINGUAL
  Filled 2018-08-16: qty 25

## 2018-08-16 MED ORDER — NITROGLYCERIN 0.4 MG SL SUBL
SUBLINGUAL_TABLET | SUBLINGUAL | Status: AC
  Filled 2018-08-16: qty 2

## 2018-08-16 MED ORDER — IOPAMIDOL (ISOVUE-370) INJECTION 76%
80.0000 mL | Freq: Once | INTRAVENOUS | Status: AC | PRN
  Administered 2018-08-16: 80 mL via INTRAVENOUS

## 2019-04-06 DIAGNOSIS — Z23 Encounter for immunization: Secondary | ICD-10-CM | POA: Diagnosis not present

## 2019-05-07 ENCOUNTER — Other Ambulatory Visit: Payer: Self-pay

## 2019-05-07 DIAGNOSIS — Z20822 Contact with and (suspected) exposure to covid-19: Secondary | ICD-10-CM

## 2019-05-08 LAB — NOVEL CORONAVIRUS, NAA: SARS-CoV-2, NAA: NOT DETECTED

## 2019-05-28 DIAGNOSIS — L821 Other seborrheic keratosis: Secondary | ICD-10-CM | POA: Diagnosis not present

## 2019-05-28 DIAGNOSIS — Z85828 Personal history of other malignant neoplasm of skin: Secondary | ICD-10-CM | POA: Diagnosis not present

## 2019-06-17 DIAGNOSIS — N138 Other obstructive and reflux uropathy: Secondary | ICD-10-CM | POA: Diagnosis not present

## 2019-06-17 DIAGNOSIS — N528 Other male erectile dysfunction: Secondary | ICD-10-CM | POA: Diagnosis not present

## 2019-06-17 DIAGNOSIS — N401 Enlarged prostate with lower urinary tract symptoms: Secondary | ICD-10-CM | POA: Diagnosis not present

## 2019-06-24 IMAGING — CT CT HEART MORP W/ CTA COR W/ SCORE W/ CA W/CM &/OR W/O CM
4 of 6 series · 10 of 20 positions shown, 11 images · non-contrast
Comparison: None.

Addendum:
EXAM:
OVER-READ INTERPRETATION  CT CHEST

The following report is an over-read performed by radiologist Dr.
Draven Tull [REDACTED] on 08/16/2018. This
over-read does not include interpretation of cardiac or coronary
anatomy or pathology. The coronary calcium score/coronary CTA
interpretation by the cardiologist is attached.
CLINICAL DATA: 77M with hypertension, hyperlipidemia and atypical
chest pain.
Cardiac/Coronary  CT
TECHNIQUE: The patient was scanned on a Phillips Force scanner.

[Series 6: best diast 75 % · axial · 0.39mm/px · z∈[+1204,+1252]mm · 2 of 361 slices shown, 3 images]
[im 121/361  vessel]
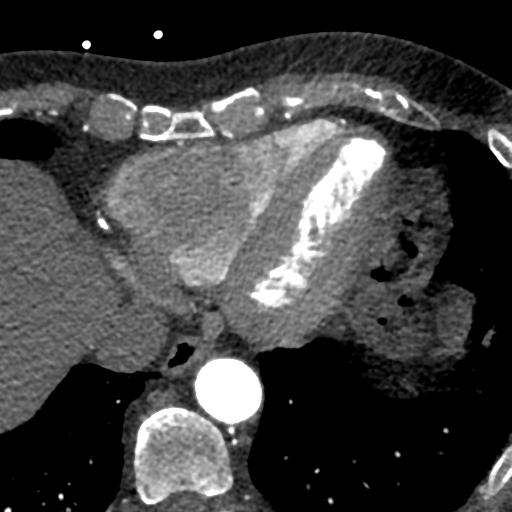
[im 121/361  lung]
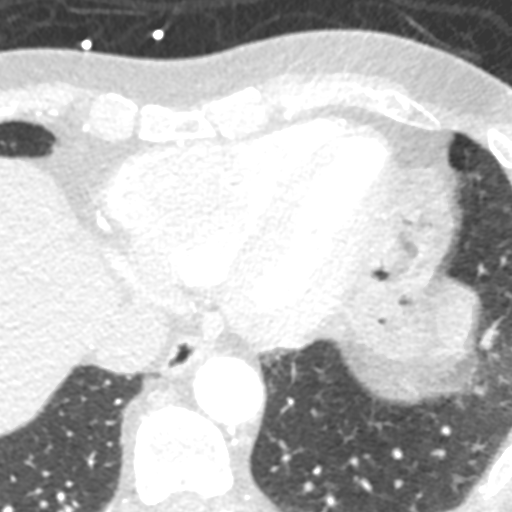
[im 241/361  vessel]
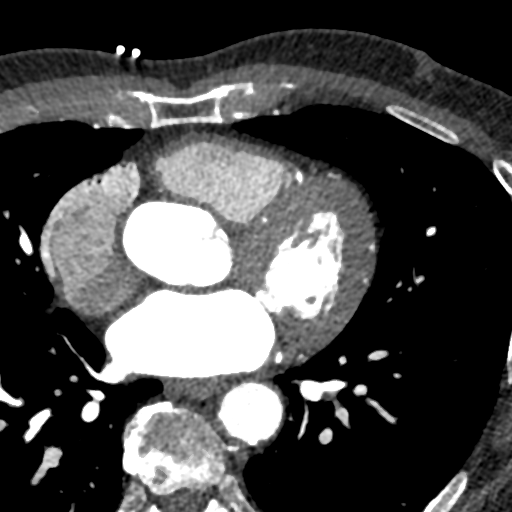

[Series 7: best syst 34 % · axial · 0.39mm/px · z∈[+1204,+1252]mm · 2 of 361 slices shown]
[im 121/361  vessel]
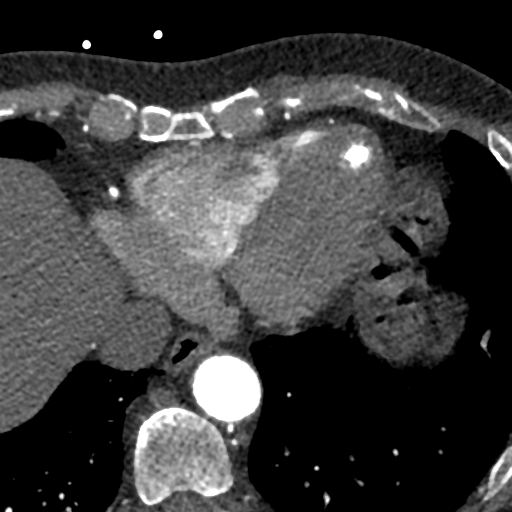
[im 241/361  vessel]
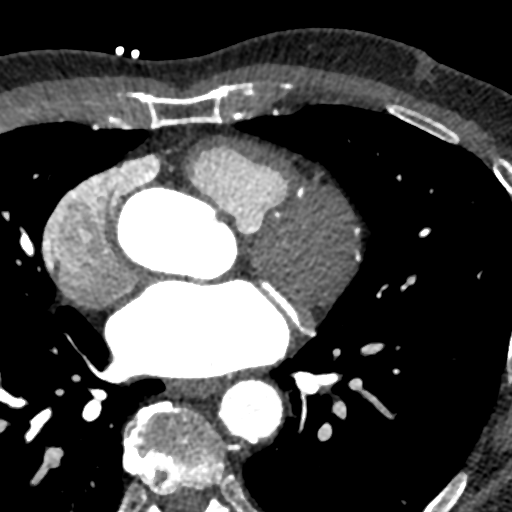

[Series 8: ts diast sharp 75 % · axial · 0.39mm/px · z∈[+1192,+1264]mm · 3 of 361 slices shown]
[im 91/361  lung]
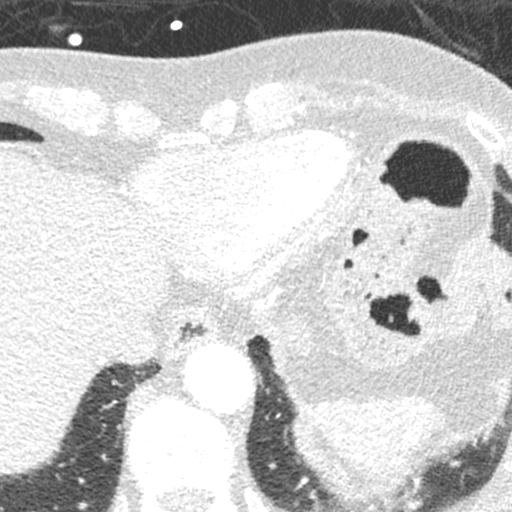
[im 181/361  lung]
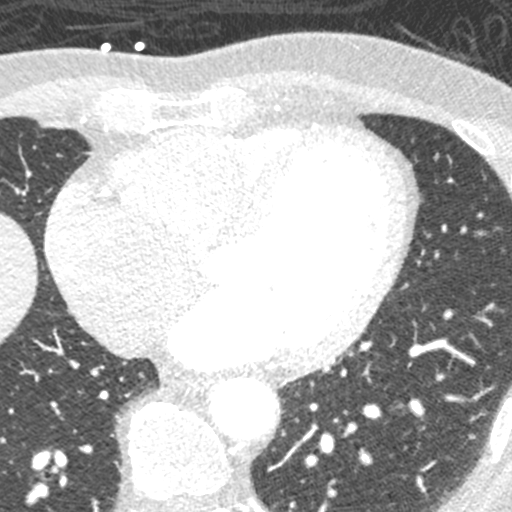
[im 271/361  lung]
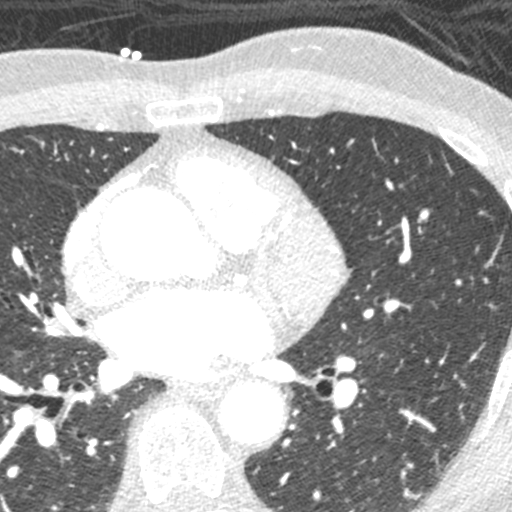

[Series 9: ts syst sharp 34 % · axial · 0.39mm/px · z∈[+1192,+1264]mm · 3 of 361 slices shown]
[im 91/361  lung]
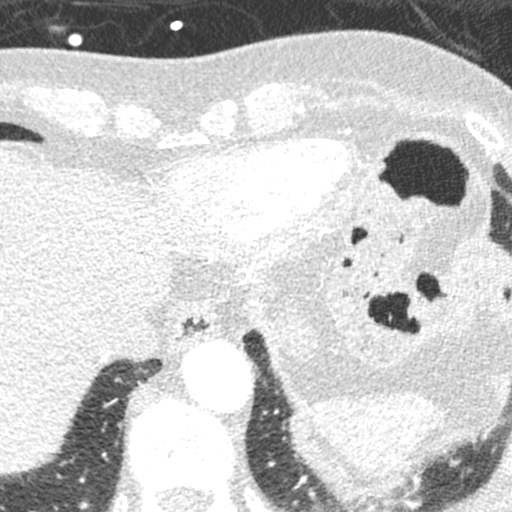
[im 181/361  lung]
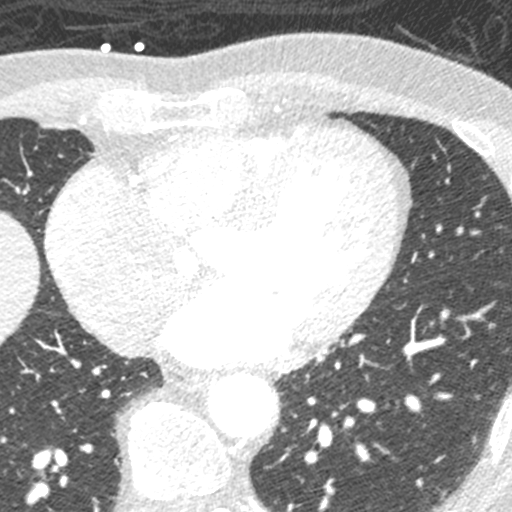
[im 271/361  lung]
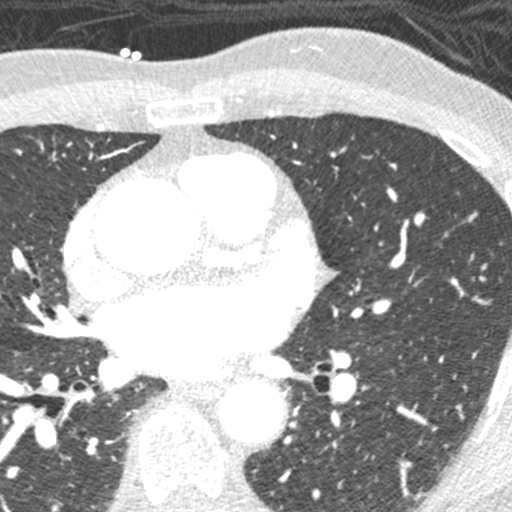

[10 of 20 positions shown; findings below may reference images not displayed]

FINDINGS: Aortic atherosclerosis. Within the visualized portions of the thorax
there are no suspicious appearing pulmonary nodules or masses, there
is no acute consolidative airspace disease, no pleural effusions, no
pneumothorax and no lymphadenopathy. Visualized portions of the
upper abdomen are unremarkable. There are no aggressive appearing
lytic or blastic lesions noted in the visualized portions of the
skeleton.
IMPRESSION: 1.  Aortic Atherosclerosis ([id number]).
FINDINGS: A 120 kV prospective scan was triggered in the descending thoracic
aorta at 111 HU's. Axial non-contrast 3 mm slices were carried out
through the heart. The data set was analyzed on a dedicated work
station and scored using the Agatson method. Gantry rotation speed
was 250 msecs and collimation was .6 mm. No beta blockade and 0.8 mg
of sl NTG was given. The 3D data set was reconstructed in 5%
intervals of the 67-82 % of the R-R cycle. Diastolic phases were
analyzed on a dedicated work station using MPR, MIP and VRT modes.
The patient received 80 cc of contrast.

Aorta: Ascending aorta mildly dilated. 3.8cm. Mild atherosclerosis
noted in the descending aorta. No dissection.

Aortic Valve:  Trileaflet.  No calcifications.

Coronary Arteries:  Normal coronary origin.  Right dominance.

RCA is a large dominant artery that gives rise to PDA and PLVB.
There is no plaque.

Left main is a large artery that gives rise to LAD and LCX arteries.

LAD is a large vessel that has no plaque. There is a large branching
D1 with no plaque.

LCX is a non-dominant artery that gives rise to two OM branches.
There is minimal (much less than 25%) calcified plaque in the ostium
and otherwise no plaque in the LCX territory.

Other findings:

Normal pulmonary vein drainage into the left atrium.

Normal let atrial appendage without a thrombus.

Normal size of the pulmonary artery.
IMPRESSION: 1. Coronary calcium score of 0. This was 0 percentile for age and
sex matched control.

2. Normal coronary origin with right dominance.

3. No significant CAD.

4.  Mild atherosclerosis of the descending aorta.

5. Mild ascending aortic aneurysm unchanged from echocardiogram

*** End of Addendum ***

## 2019-07-22 DIAGNOSIS — Z125 Encounter for screening for malignant neoplasm of prostate: Secondary | ICD-10-CM | POA: Diagnosis not present

## 2019-07-22 DIAGNOSIS — Z0000 Encounter for general adult medical examination without abnormal findings: Secondary | ICD-10-CM | POA: Diagnosis not present

## 2019-07-22 DIAGNOSIS — E785 Hyperlipidemia, unspecified: Secondary | ICD-10-CM | POA: Diagnosis not present

## 2019-07-22 DIAGNOSIS — R946 Abnormal results of thyroid function studies: Secondary | ICD-10-CM | POA: Diagnosis not present

## 2019-07-22 DIAGNOSIS — R252 Cramp and spasm: Secondary | ICD-10-CM | POA: Diagnosis not present

## 2019-07-23 DIAGNOSIS — R82998 Other abnormal findings in urine: Secondary | ICD-10-CM | POA: Diagnosis not present

## 2019-07-25 ENCOUNTER — Ambulatory Visit: Payer: Medicare HMO | Attending: Internal Medicine

## 2019-07-25 DIAGNOSIS — Z23 Encounter for immunization: Secondary | ICD-10-CM

## 2019-07-25 NOTE — Progress Notes (Signed)
   Covid-19 Vaccination Clinic  Name:  Tommy Hubbard    MRN: [id number] DOB: 09-11-1940  07/25/2019  Mr. Tommy Hubbard was observed post Covid-19 immunization for 15 minutes without incidence. He was provided with Vaccine Information Sheet and instruction to access the V-Safe system.   Mr. Tommy Hubbard was instructed to call 911 with any severe reactions post vaccine: Marland Kitchen Difficulty breathing  . Swelling of your face and throat  . A fast heartbeat  . A bad rash all over your body  . Dizziness and weakness    Immunizations Administered    Name Date Dose VIS Date Route   Pfizer COVID-19 Vaccine 07/25/2019 10:34 AM 0.3 mL 06/14/2019 Intramuscular   Manufacturer: [place]   Lot: [id number]   [place]: [id number]

## 2019-07-29 ENCOUNTER — Ambulatory Visit: Payer: PPO

## 2019-07-29 DIAGNOSIS — R7401 Elevation of levels of liver transaminase levels: Secondary | ICD-10-CM | POA: Diagnosis not present

## 2019-07-29 DIAGNOSIS — I7090 Unspecified atherosclerosis: Secondary | ICD-10-CM | POA: Diagnosis not present

## 2019-07-29 DIAGNOSIS — N401 Enlarged prostate with lower urinary tract symptoms: Secondary | ICD-10-CM | POA: Diagnosis not present

## 2019-07-29 DIAGNOSIS — C4491 Basal cell carcinoma of skin, unspecified: Secondary | ICD-10-CM | POA: Diagnosis not present

## 2019-07-29 DIAGNOSIS — R011 Cardiac murmur, unspecified: Secondary | ICD-10-CM | POA: Diagnosis not present

## 2019-07-29 DIAGNOSIS — I10 Essential (primary) hypertension: Secondary | ICD-10-CM | POA: Diagnosis not present

## 2019-07-29 DIAGNOSIS — R946 Abnormal results of thyroid function studies: Secondary | ICD-10-CM | POA: Diagnosis not present

## 2019-07-29 DIAGNOSIS — E785 Hyperlipidemia, unspecified: Secondary | ICD-10-CM | POA: Diagnosis not present

## 2019-07-29 DIAGNOSIS — Z1331 Encounter for screening for depression: Secondary | ICD-10-CM | POA: Diagnosis not present

## 2019-07-29 DIAGNOSIS — D126 Benign neoplasm of colon, unspecified: Secondary | ICD-10-CM | POA: Diagnosis not present

## 2019-08-01 DIAGNOSIS — Z1212 Encounter for screening for malignant neoplasm of rectum: Secondary | ICD-10-CM | POA: Diagnosis not present

## 2019-08-07 ENCOUNTER — Other Ambulatory Visit: Payer: Self-pay

## 2019-08-07 ENCOUNTER — Ambulatory Visit (HOSPITAL_COMMUNITY): Payer: Medicare HMO | Attending: Cardiology

## 2019-08-07 DIAGNOSIS — R69 Illness, unspecified: Secondary | ICD-10-CM | POA: Diagnosis not present

## 2019-08-07 DIAGNOSIS — I351 Nonrheumatic aortic (valve) insufficiency: Secondary | ICD-10-CM

## 2019-08-12 ENCOUNTER — Encounter: Payer: Self-pay | Admitting: Cardiovascular Disease

## 2019-08-12 ENCOUNTER — Ambulatory Visit: Payer: Medicare HMO | Admitting: Cardiovascular Disease

## 2019-08-12 ENCOUNTER — Other Ambulatory Visit: Payer: Self-pay

## 2019-08-12 VITALS — BP 160/80 | HR 67 | Ht 69.0 in | Wt 161.2 lb

## 2019-08-12 DIAGNOSIS — Z5181 Encounter for therapeutic drug level monitoring: Secondary | ICD-10-CM

## 2019-08-12 DIAGNOSIS — I10 Essential (primary) hypertension: Secondary | ICD-10-CM

## 2019-08-12 DIAGNOSIS — I712 Thoracic aortic aneurysm, without rupture: Secondary | ICD-10-CM

## 2019-08-12 DIAGNOSIS — I7121 Aneurysm of the ascending aorta, without rupture: Secondary | ICD-10-CM

## 2019-08-12 MED ORDER — TELMISARTAN 40 MG PO TABS: 40.0000 mg | ORAL_TABLET | Freq: Every day | ORAL | 3 refills | Status: DC

## 2019-08-12 NOTE — Progress Notes (Signed)
Cardiology Office Note   Date:  08/12/2019   ID:  Tommy Hubbard, DOB Jan 15, 1941, MRN [id number]  PCP:  Crist Infante, MD  Cardiologist:   Skeet Latch, MD   No chief complaint on file.     History of Present Illness: Tommy Hubbard is a 79 y.o. male with hypertension, mild-moderate aortic regurgitation, mild aortic aneurysm, and hyperlipidemia here for follow up.  He was initially seen 08/06/18 for the evaluation of chest discomfort.  He reported intermittent episodes of chest discomfort and mild exertional dyspnea.  He was referred for coronary CT-A 08/2018 that revealed a coronary calcium score of 0.  There was mild atherosclerosis of the descending aorta.  There is a mild ascending aorta unchanged from his previous echo.  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  He saw his PCP, Dr. Joylene Draft and his blood pressure was noted to be elevated.  He was started on valsartan.  After starting this medication he wore an ambulatory monitor that showed his blood pressure was 118/65 on average.  Dr. Joylene Draft noted a murmur on exam.  He had an echo 06/2018 that revealed LVEF 55 to 60% with grade 1 diastolic dysfunction.  He had mild calcification of the aortic valve with mild to moderate regurgitation.   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  He continues to exercise regularly by playing tennis and golf.  He has not experienced any recurrent chest pain and his breathing has been stable.  He denies lower extremity edema, orthopnea, or PND.  He checks his blood pressure at home and typically it is well have been controlled.  This morning it was 126/78.  He notes that he does not check it often.  Prior to this appointment he had a repeat echocardiogram 08/2019 that revealed LVEF 65 to 70% with mild diastolic dysfunction.  There was mild AR and the ascending aorta was 4.0 cm.   Past Medical History:  Diagnosis Date   . Aneurysm of ascending aorta (HCC) 08/06/2018   Mild 06/2018 on echo.  . Aortic regurgitation 08/06/2018   Mild to moderate 06/2018  . Atypical chest pain 08/06/2018  . BPH (benign prostatic hyperplasia)   . Essential hypertension 08/06/2018  . Polio    age 54  . Pure hypercholesterolemia 08/06/2018    Past Surgical History:  Procedure Laterality Date  . ADENOIDECTOMY    . CATARACT EXTRACTION, BILATERAL    . INGUINAL HERNIA REPAIR Right 11/19/2012   Procedure: HERNIA REPAIR INGUINAL ADULT with mesh;  Surgeon: Shann Medal, MD;  Location: [facility];  Service: General;  Laterality: Right;  . INSERTION OF MESH Right 11/19/2012   Procedure: INSERTION OF MESH;  Surgeon: Shann Medal, MD;  Location: [facility];  Service: General;  Laterality: Right;  . ROTATOR CUFF REPAIR  2016  . TONSILLECTOMY       Current Outpatient Medications  Medication Sig Dispense Refill  . Cholecalciferol (VITAMIN D3) 50 MCG (2000 UT) capsule Take by mouth.    . Multiple Vitamins-Minerals (MULTIVITAMIN WITH MINERALS) tablet Take by mouth.    . sildenafil (REVATIO) 20 MG tablet     . telmisartan (MICARDIS) 40 MG tablet Take 1 tablet (40 mg total) by mouth daily. 90 tablet 3   No current facility-administered medications for this visit.    Allergies:   Poison ivy extract and Tetanus toxoids    Social History:  The patient  reports that  he has never smoked. He has never used smokeless tobacco. He reports current alcohol use. He reports that he does not use drugs.   Family History:  The patient's family history includes Aortic stenosis in his father; Atrial fibrillation in his father; Breast cancer in his maternal grandmother; Cancer in his father and another family member; Hypertension in an other family member.    ROS:  Please see the history of present illness.   Otherwise, review of systems are positive for none.   All other systems are reviewed and negative.    PHYSICAL EXAM: VS:  BP (!) 160/80   Pulse 67   Ht  5\' 9"  (1.753 m)   Wt 161 lb 3.2 oz (73.1 kg)   BMI 23.81 kg/m  , BMI Body mass index is 23.81 kg/m. GENERAL:  Well appearing HEENT:  Pupils equal round and reactive, fundi not visualized, oral mucosa unremarkable NECK:  No jugular venous distention, waveform within normal limits, carotid upstroke brisk and symmetric, no bruits LUNGS:  Clear to auscultation bilaterally HEART:  RRR.  PMI not displaced or sustained,S1 and S2 within normal limits, no S3, no S4, no clicks, no rubs, no murmurs ABD:  Flat, positive bowel sounds normal in frequency in pitch, no bruits, no rebound, no guarding, no midline pulsatile mass, no hepatomegaly, no splenomegaly EXT:  2 plus pulses throughout, no edema, no cyanosis no clubbing SKIN:  No rashes no nodules NEURO:  Cranial nerves II through XII grossly intact, motor grossly intact throughout PSYCH:  Cognitively intact, oriented to person place and time   EKG:  EKG is ordered today. The ekg ordered today demonstrates sinus rhythm.  Rate 76 bpm.  LAFB.  Non-specific IVCD.   08/12/19: Sinus rhythm.  RBBB.  Rate 67 bpm. LAFB.   Echo 06/04/18: Study Conclusions  - Left ventricle: The cavity size was normal. Wall thickness was   increased in a pattern of mild LVH. Systolic function was normal.   The estimated ejection fraction was in the range of 55% to 60%.   Wall motion was normal; there were no regional wall motion   abnormalities. Doppler parameters are consistent with abnormal   left ventricular relaxation (grade 1 diastolic dysfunction). - Aortic valve: Trileaflet; mildly calcified leaflets. There was no   stenosis. There was mild to moderate regurgitation. - Ascending aorta: The ascending aorta was mildly dilated at 38 mm. - Mitral valve: Mildly calcified annulus. - Right ventricle: The cavity size was normal. Systolic function   was normal. - Pulmonary arteries: No complete TR doppler jet so unable to   estimate PA systolic pressure. - Inferior  vena cava: The vessel was normal in size. The   respirophasic diameter changes were in the normal range (= 50%),   consistent with normal central venous pressure.  Impressions:  - Normal LV size with mild LV hypertrophy. EF 55-60%. Normal RV   size and systolic function. Mild to moderate aortic   insufficiency.  Echo 08/07/19: IMPRESSIONS   1. Left ventricular ejection fraction, by visual estimation, is 65 to  70%. The left ventricle has hyperdynamic function. There is moderately  increased left ventricular hypertrophy.  2. Left ventricular diastolic parameters are consistent with Grade I  diastolic dysfunction (impaired relaxation).  3. The left ventricle has no regional wall motion abnormalities.  4. Global right ventricle has normal systolic function.The right  ventricular size is normal. No increase in right ventricular wall  thickness.  5. Left atrial size was normal.  6.  Right atrial size was normal.  7. The mitral valve is normal in structure. Mild mitral valve  regurgitation. No evidence of mitral stenosis.  8. The tricuspid valve is normal in structure.  9. The tricuspid valve is normal in structure. Tricuspid valve  regurgitation is not demonstrated.  10. The aortic valve is tricuspid. Aortic valve regurgitation is mild.  Mild aortic valve sclerosis without stenosis.  11. The pulmonic valve was normal in structure. Pulmonic valve  regurgitation is not visualized.  12. There is mild dilatation of the ascending aorta measuring 40 mm.  13. The inferior vena cava is normal in size with greater than 50%  respiratory variability, suggesting right atrial pressure of 3 mmHg.   Coronary CT-A 08/16/18: IMPRESSION: 1. Coronary calcium score of 0. This was 0 percentile for age and sex matched control.  2. Normal coronary origin with right dominance.  3. No significant CAD.  4.  Mild atherosclerosis of the descending aorta.  5. Mild ascending aortic aneurysm  unchanged from echocardiogram 06/2018.   Recent Labs: No results found for requested labs within last 8760 hours.    Lipid Panel No results found for: CHOL, TRIG, HDL, CHOLHDL, VLDL, LDLCALC, LDLDIRECT    Wt Readings from Last 3 Encounters:  08/12/19 161 lb 3.2 oz (73.1 kg)  08/06/18 159 lb 6.4 oz (72.3 kg)  01/02/18 168 lb (76.2 kg)      ASSESSMENT AND PLAN:  # Chest discomfort: Resolved.  Coronary CT-A showed a calcium score of 0.  # Hypertension: BP is elevated and he had LVH on echo.  Septal thickness was reported at 1.8 cm.  It appears that this was not a full diastolic measurement.  However it is at least mild LVH.  Therefore we will be more aggressive about his blood pressure control.  Increase telmisartan to 40 mg.  Check a basic metabolic panel in 1 week.  # Hyperlipidemia:  LDL was 100 on 07/2019.  # Mild-moderate aortic regurgitation: # Ascending aorta aneurysm: Ascending aorta was 3.8 cm on 06/2018 and increased to 4.0 cm on 08/2019.  Repeat echo in 1 year.  Mild aortic regurgitaiton.  Repeat echo in 1 year.   Current medicines are reviewed at length with the patient today.  The patient does not have concerns regarding medicines.  The following changes have been made:  no change  Labs/ tests ordered today include:   Orders Placed This Encounter  Procedures  . Basic metabolic panel  . EKG 12-Lead     Disposition:   FU with Tommy Weddington C. Oval Linsey, MD, [facility] in 1 year.      Signed, Issis Lindseth C. Oval Linsey, MD, [facility]  08/12/2019 3:54 PM    [facility]

## 2019-08-12 NOTE — Addendum Note (Signed)
Addended by: Alvina Filbert B on: 08/12/2019 04:58 PM   Modules accepted: Orders

## 2019-08-12 NOTE — Patient Instructions (Signed)
Medication Instructions:  INCREASE YOUR TELMISARTAN TO 40 MG DAILY   *If you need a refill on your cardiac medications before your next appointment, please call your pharmacy*  Lab Work: BMET IN 1 WEEK   If you have labs (blood work) drawn today and your tests are completely normal, you will receive your results only by: Marland Kitchen MyChart Message (if you have MyChart) OR . A paper copy in the mail If you have any lab test that is abnormal or we need to change your treatment, we will call you to review the results.  Testing/Procedures: Your physician has requested that you have an echocardiogram. Echocardiography is a painless test that uses sound waves to create images of your heart. It provides your doctor with information about the size and shape of your heart and how well your heart's chambers and valves are working. This procedure takes approximately one hour. There are no restrictions for this procedure. IN 1 YEAR FEW DAYS PRIOR TO FOLLOW UP   Follow-Up: At [facility], you and your health needs are our priority.  As part of our continuing mission to provide you with exceptional heart care, we have created designated Provider Care Teams.  These Care Teams include your primary Cardiologist (physician) and Advanced Practice Providers (APPs -  Physician Assistants and Nurse Practitioners) who all work together to provide you with the care you need, when you need it.  Your next appointment:   12 month(s) You will receive a reminder letter in the mail two months in advance. If you don't receive a letter, please call our office to schedule the follow-up appointment.  The format for your next appointment:   In Person  Provider:   You may see DR [facility]  or one of the following Advanced Practice Providers on your designated Care Team:    Kerin Ransom, PA-C  [place], [place]  Coletta Memos, 

## 2019-08-15 ENCOUNTER — Ambulatory Visit: Payer: Medicare HMO | Attending: Internal Medicine

## 2019-08-15 DIAGNOSIS — Z23 Encounter for immunization: Secondary | ICD-10-CM | POA: Insufficient documentation

## 2019-08-15 NOTE — Progress Notes (Signed)
   Covid-19 Vaccination Clinic  Name:  Tommy Hubbard    MRN: [id number] DOB: 14-May-1941  08/15/2019  Tommy Hubbard was observed post Covid-19 immunization for 15 minutes without incidence. He was provided with Vaccine Information Sheet and instruction to access the V-Safe system.   Tommy Hubbard was instructed to call 911 with any severe reactions post vaccine: Marland Kitchen Difficulty breathing  . Swelling of your face and throat  . A fast heartbeat  . A bad rash all over your body  . Dizziness and weakness    Immunizations Administered    Name Date Dose VIS Date Route   Pfizer COVID-19 Vaccine 08/15/2019  8:33 AM 0.3 mL 06/14/2019 Intramuscular   Manufacturer: [place]   Lot: [id number]   [place]: [id number]

## 2019-08-19 DIAGNOSIS — I10 Essential (primary) hypertension: Secondary | ICD-10-CM | POA: Diagnosis not present

## 2019-08-19 DIAGNOSIS — Z5181 Encounter for therapeutic drug level monitoring: Secondary | ICD-10-CM | POA: Diagnosis not present

## 2019-08-20 LAB — BASIC METABOLIC PANEL
BUN/Creatinine Ratio: 19 (ref 10–24)
BUN: 17 mg/dL (ref 8–27)
CO2: 23 mmol/L (ref 20–29)
Calcium: 9 mg/dL (ref 8.6–10.2)
Chloride: 106 mmol/L (ref 96–106)
Creatinine, Ser: 0.9 mg/dL (ref 0.76–1.27)
GFR calc Af Amer: 94 mL/min/{1.73_m2} (ref >59)
GFR calc non Af Amer: 82 mL/min/{1.73_m2} (ref >59)
Glucose: 95 mg/dL (ref 65–99)
Potassium: 4.6 mmol/L (ref 3.5–5.2)
Sodium: 142 mmol/L (ref 134–144)

## 2019-09-19 ENCOUNTER — Other Ambulatory Visit: Payer: Self-pay | Admitting: *Deleted

## 2019-09-19 MED ORDER — TELMISARTAN 40 MG PO TABS: 40.0000 mg | ORAL_TABLET | Freq: Every day | ORAL | 3 refills | Status: DC

## 2019-09-19 NOTE — Telephone Encounter (Signed)
Refilled as requested  

## 2019-10-16 DIAGNOSIS — L570 Actinic keratosis: Secondary | ICD-10-CM | POA: Diagnosis not present

## 2019-10-16 DIAGNOSIS — L821 Other seborrheic keratosis: Secondary | ICD-10-CM | POA: Diagnosis not present

## 2019-10-16 DIAGNOSIS — D224 Melanocytic nevi of scalp and neck: Secondary | ICD-10-CM | POA: Diagnosis not present

## 2019-10-16 DIAGNOSIS — C44519 Basal cell carcinoma of skin of other part of trunk: Secondary | ICD-10-CM | POA: Diagnosis not present

## 2019-10-16 DIAGNOSIS — L814 Other melanin hyperpigmentation: Secondary | ICD-10-CM | POA: Diagnosis not present

## 2019-10-16 DIAGNOSIS — Z85828 Personal history of other malignant neoplasm of skin: Secondary | ICD-10-CM | POA: Diagnosis not present

## 2019-10-16 DIAGNOSIS — D692 Other nonthrombocytopenic purpura: Secondary | ICD-10-CM | POA: Diagnosis not present

## 2019-10-16 DIAGNOSIS — D225 Melanocytic nevi of trunk: Secondary | ICD-10-CM | POA: Diagnosis not present

## 2019-10-16 DIAGNOSIS — D1801 Hemangioma of skin and subcutaneous tissue: Secondary | ICD-10-CM | POA: Diagnosis not present

## 2019-10-16 DIAGNOSIS — D485 Neoplasm of uncertain behavior of skin: Secondary | ICD-10-CM | POA: Diagnosis not present

## 2020-02-10 DIAGNOSIS — Z961 Presence of intraocular lens: Secondary | ICD-10-CM | POA: Diagnosis not present

## 2020-02-10 DIAGNOSIS — H26493 Other secondary cataract, bilateral: Secondary | ICD-10-CM | POA: Diagnosis not present

## 2020-02-13 DIAGNOSIS — Z0100 Encounter for examination of eyes and vision without abnormal findings: Secondary | ICD-10-CM | POA: Diagnosis not present

## 2020-02-13 DIAGNOSIS — R69 Illness, unspecified: Secondary | ICD-10-CM | POA: Diagnosis not present

## 2020-04-22 DIAGNOSIS — Z23 Encounter for immunization: Secondary | ICD-10-CM | POA: Diagnosis not present

## 2020-05-17 DIAGNOSIS — Z20822 Contact with and (suspected) exposure to covid-19: Secondary | ICD-10-CM | POA: Diagnosis not present

## 2020-06-22 DIAGNOSIS — N529 Male erectile dysfunction, unspecified: Secondary | ICD-10-CM | POA: Diagnosis not present

## 2020-06-22 DIAGNOSIS — N138 Other obstructive and reflux uropathy: Secondary | ICD-10-CM | POA: Diagnosis not present

## 2020-06-22 DIAGNOSIS — N401 Enlarged prostate with lower urinary tract symptoms: Secondary | ICD-10-CM | POA: Diagnosis not present

## 2020-06-24 ENCOUNTER — Encounter: Payer: Self-pay | Admitting: Cardiovascular Disease

## 2020-07-12 DIAGNOSIS — Z1152 Encounter for screening for COVID-19: Secondary | ICD-10-CM | POA: Diagnosis not present

## 2020-07-23 DIAGNOSIS — M8589 Other specified disorders of bone density and structure, multiple sites: Secondary | ICD-10-CM | POA: Diagnosis not present

## 2020-07-23 DIAGNOSIS — Z125 Encounter for screening for malignant neoplasm of prostate: Secondary | ICD-10-CM | POA: Diagnosis not present

## 2020-07-23 DIAGNOSIS — E785 Hyperlipidemia, unspecified: Secondary | ICD-10-CM | POA: Diagnosis not present

## 2020-07-23 DIAGNOSIS — I10 Essential (primary) hypertension: Secondary | ICD-10-CM | POA: Diagnosis not present

## 2020-08-10 ENCOUNTER — Ambulatory Visit (HOSPITAL_COMMUNITY): Payer: Medicare HMO | Attending: Cardiovascular Disease

## 2020-08-10 ENCOUNTER — Other Ambulatory Visit: Payer: Self-pay

## 2020-08-10 ENCOUNTER — Other Ambulatory Visit (HOSPITAL_COMMUNITY): Payer: Medicare HMO

## 2020-08-10 DIAGNOSIS — I7090 Unspecified atherosclerosis: Secondary | ICD-10-CM | POA: Diagnosis not present

## 2020-08-10 DIAGNOSIS — R82998 Other abnormal findings in urine: Secondary | ICD-10-CM | POA: Diagnosis not present

## 2020-08-10 DIAGNOSIS — I10 Essential (primary) hypertension: Secondary | ICD-10-CM

## 2020-08-10 DIAGNOSIS — R011 Cardiac murmur, unspecified: Secondary | ICD-10-CM | POA: Diagnosis not present

## 2020-08-10 DIAGNOSIS — M8580 Other specified disorders of bone density and structure, unspecified site: Secondary | ICD-10-CM | POA: Diagnosis not present

## 2020-08-10 DIAGNOSIS — I7121 Aneurysm of the ascending aorta, without rupture: Secondary | ICD-10-CM

## 2020-08-10 DIAGNOSIS — R3121 Asymptomatic microscopic hematuria: Secondary | ICD-10-CM | POA: Diagnosis not present

## 2020-08-10 DIAGNOSIS — Z0000 Encounter for general adult medical examination without abnormal findings: Secondary | ICD-10-CM | POA: Diagnosis not present

## 2020-08-10 DIAGNOSIS — I712 Thoracic aortic aneurysm, without rupture: Secondary | ICD-10-CM

## 2020-08-10 DIAGNOSIS — Z1331 Encounter for screening for depression: Secondary | ICD-10-CM | POA: Diagnosis not present

## 2020-08-10 DIAGNOSIS — Z1212 Encounter for screening for malignant neoplasm of rectum: Secondary | ICD-10-CM | POA: Diagnosis not present

## 2020-08-10 DIAGNOSIS — E785 Hyperlipidemia, unspecified: Secondary | ICD-10-CM | POA: Diagnosis not present

## 2020-08-10 LAB — ECHOCARDIOGRAM COMPLETE
Area-P 1/2: 3.94 cm2
P 1/2 time: 819 msec
S' Lateral: 3.3 cm

## 2020-08-13 ENCOUNTER — Ambulatory Visit: Payer: Medicare HMO | Admitting: Cardiovascular Disease

## 2020-09-03 ENCOUNTER — Other Ambulatory Visit: Payer: Self-pay | Admitting: Cardiovascular Disease

## 2020-10-01 ENCOUNTER — Ambulatory Visit: Payer: Medicare HMO | Admitting: Cardiovascular Disease

## 2020-10-01 ENCOUNTER — Encounter: Payer: Self-pay | Admitting: Cardiovascular Disease

## 2020-10-01 ENCOUNTER — Other Ambulatory Visit: Payer: Self-pay

## 2020-10-01 VITALS — BP 164/78 | HR 63 | Ht 69.0 in | Wt 159.6 lb

## 2020-10-01 DIAGNOSIS — I7121 Aneurysm of the ascending aorta, without rupture: Secondary | ICD-10-CM

## 2020-10-01 DIAGNOSIS — E7800 Pure hypercholesterolemia, unspecified: Secondary | ICD-10-CM

## 2020-10-01 DIAGNOSIS — Z5181 Encounter for therapeutic drug level monitoring: Secondary | ICD-10-CM

## 2020-10-01 DIAGNOSIS — I351 Nonrheumatic aortic (valve) insufficiency: Secondary | ICD-10-CM | POA: Diagnosis not present

## 2020-10-01 DIAGNOSIS — I10 Essential (primary) hypertension: Secondary | ICD-10-CM | POA: Diagnosis not present

## 2020-10-01 DIAGNOSIS — I712 Thoracic aortic aneurysm, without rupture: Secondary | ICD-10-CM

## 2020-10-01 NOTE — Patient Instructions (Signed)
Medication Instructions:  Your physician recommends that you continue on your current medications as directed. Please refer to the Current Medication list given to you today.  *If you need a refill on your cardiac medications before your next appointment, please call your pharmacy*   Follow-Up: At [facility], you and your health needs are our priority.  As part of our continuing mission to provide you with exceptional heart care, we have created designated Provider Care Teams.  These Care Teams include your primary Cardiologist (physician) and Advanced Practice Providers (APPs -  Physician Assistants and Nurse Practitioners) who all work together to provide you with the care you need, when you need it.  We recommend signing up for the patient portal called "MyChart".  Sign up information is provided on this After Visit Summary.  MyChart is used to connect with patients for Virtual Visits (Telemedicine).  Patients are able to view lab/test results, encounter notes, upcoming appointments, etc.  Non-urgent messages can be sent to your provider as well.   To learn more about what you can do with MyChart, go to [email or web address].    Your next appointment:   12 month(s)  The format for your next appointment:   In Person  Provider:   Skeet Latch, MD   Other Instructions Please keep a blood pressure log twice daily for 30 days and then back to see a PharmD for medication management.

## 2020-10-01 NOTE — Progress Notes (Signed)
Cardiology Office Note   Date:  10/01/2020   ID:  Tommy Hubbard, DOB [birth year], MRN [id number]  PCP:  Crist Infante, MD  Cardiologist:   Skeet Latch, MD   No chief complaint on file.    History of Present Illness: Tommy Hubbard is a 80 y.o. male with hypertension, mild-moderate aortic regurgitation, mild aortic aneurysm, and hyperlipidemia here for follow up.  He was initially seen 08/06/18 for the evaluation of chest discomfort.  He reported intermittent episodes of chest discomfort and mild exertional dyspnea.  He was referred for coronary CT-A 08/2018 that revealed a coronary calcium score of 0.  There was mild atherosclerosis of the descending aorta.  There is a mild ascending aorta unchanged from his previous echo.  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  He saw his PCP, Dr. Joylene Draft and his blood pressure was noted to be elevated.  He was started on valsartan.  After starting this medication he wore an ambulatory monitor that showed his blood pressure was 118/65 on average.  Dr. Joylene Draft noted a murmur on exam.  He had an echo 06/2018 that revealed LVEF 55 to 60% with grade 1 diastolic dysfunction.  He had mild calcification of the aortic valve with mild to moderate regurgitation. Prior to this appointment he had a repeat echocardiogram.   He has been feeling well. He reports that he is moving to a different part of town. It has his attention at this time. He plays tennis twice a week and golf once a week. His SOB and chest tightness has resolved since his last visit. He has no chest pain or pressure at this time. He has no PND or palpitations.  He has occasional dizziness after exercising but he has not detected a pattern. However he checked his bp during the last episodes and it was 98/54 and later that day it was 130/70 but overall he has been feel fine. He notes that his low bp episode did not occur after taking  his morning medication. He takes his telmisartan  at bedtime. He does not salt his foods and his wife cooks all of his meals. He states that she cooks healthy. He denies any LE edema.     Past Medical History:  Diagnosis Date  . Aneurysm of ascending aorta (HCC) 08/06/2018   Mild 06/2018 on echo.  . Aortic regurgitation 08/06/2018   Mild to moderate 06/2018  . Atypical chest pain 08/06/2018  . BPH (benign prostatic hyperplasia)   . Essential hypertension 08/06/2018  . Polio    age 66  . Pure hypercholesterolemia 08/06/2018    Past Surgical History:  Procedure Laterality Date  . ADENOIDECTOMY    . CATARACT EXTRACTION, BILATERAL    . INGUINAL HERNIA REPAIR Right 11/19/2012   Procedure: HERNIA REPAIR INGUINAL ADULT with mesh;  Surgeon: Shann Medal, MD;  Location: [facility];  Service: General;  Laterality: Right;  . INSERTION OF MESH Right 11/19/2012   Procedure: INSERTION OF MESH;  Surgeon: Shann Medal, MD;  Location: [facility];  Service: General;  Laterality: Right;  . ROTATOR CUFF REPAIR  2016  . TONSILLECTOMY       Current Outpatient Medications  Medication Sig Dispense Refill  . Cholecalciferol (VITAMIN D3) 50 MCG (2000 UT) capsule Take by mouth.    . Multiple Vitamins-Minerals (MULTIVITAMIN WITH MINERALS) tablet Take by mouth.    . rosuvastatin (CRESTOR) 5 MG tablet Take 5 mg by  mouth daily.    . sildenafil (REVATIO) 20 MG tablet     . tamsulosin (FLOMAX) 0.4 MG CAPS capsule 1 capsule    . telmisartan (MICARDIS) 40 MG tablet TAKE 1 TABLET BY MOUTH EVERY DAY 90 tablet 3   No current facility-administered medications for this visit.    Allergies:   Poison ivy extract and Tetanus toxoids    Social History:  The patient  reports that he has never smoked. He has never used smokeless tobacco. He reports current alcohol use. He reports that he does not use drugs.   Family History:  The patient's family history includes Aortic stenosis in his father; Atrial fibrillation in his father;  Breast cancer in his maternal grandmother; Cancer in his father and another family member; Hypertension in an other family member.    ROS:  Please see the history of present illness.   Otherwise, review of systems are positive for none.   All other systems are reviewed and negative.    PHYSICAL EXAM: VS:  BP (!) 164/78   Pulse 63   Ht 5\' 9"  (1.753 m)   Wt 159 lb 9.6 oz (72.4 kg)   SpO2 98%   BMI 23.57 kg/m  , BMI Body mass index is 23.57 kg/m. GENERAL:  Well appearing HEENT: Pupils equal round and reactive, fundi not visualized, oral mucosa unremarkable NECK:  No jugular venous distention, waveform within normal limits, carotid upstroke brisk and symmetric, no bruits LUNGS:  Clear to auscultation bilaterally HEART:  RRR.  PMI not displaced or sustained,S1 and S2 within normal limits, no S3, no S4, no clicks, no rubs, no murmurs ABD:  Flat, positive bowel sounds normal in frequency in pitch, no bruits, no rebound, no guarding, no midline pulsatile mass, no hepatomegaly, no splenomegaly EXT:  2 plus pulses throughout, no edema, no cyanosis no clubbing SKIN:  No rashes no nodules NEURO:  Cranial nerves II through XII grossly intact, motor grossly intact throughout PSYCH:  Cognitively intact, oriented to person place and time   EKG:  EKG is ordered today. The ekg ordered today demonstrates sinus rhythm.  Rate 76 bpm.  LAFB.  Non-specific IVCD.   08/12/19: Sinus rhythm.  RBBB.  Rate 67 bpm. LAFB. 10/01/20 Sinus rhythm. RBBB. Rate:62 bpm. LAFB.   Echo 06/04/18: Study Conclusions  - Left ventricle: The cavity size was normal. Wall thickness was   increased in a pattern of mild LVH. Systolic function was normal.   The estimated ejection fraction was in the range of 55% to 60%.   Wall motion was normal; there were no regional wall motion   abnormalities. Doppler parameters are consistent with abnormal   left ventricular relaxation (grade 1 diastolic dysfunction). - Aortic valve:  Trileaflet; mildly calcified leaflets. There was no   stenosis. There was mild to moderate regurgitation. - Ascending aorta: The ascending aorta was mildly dilated at 38 mm. - Mitral valve: Mildly calcified annulus. - Right ventricle: The cavity size was normal. Systolic function   was normal. - Pulmonary arteries: No complete TR doppler jet so unable to   estimate PA systolic pressure. - Inferior vena cava: The vessel was normal in size. The   respirophasic diameter changes were in the normal range (= 50%),   consistent with normal central venous pressure.  Impressions:  - Normal LV size with mild LV hypertrophy. EF 55-60%. Normal RV   size and systolic function. Mild to moderate aortic   insufficiency.  Echo 08/07/19: IMPRESSIONS   1.  Left ventricular ejection fraction, by visual estimation, is 65 to  70%. The left ventricle has hyperdynamic function. There is moderately  increased left ventricular hypertrophy.  2. Left ventricular diastolic parameters are consistent with Grade I  diastolic dysfunction (impaired relaxation).  3. The left ventricle has no regional wall motion abnormalities.  4. Global right ventricle has normal systolic function.The right  ventricular size is normal. No increase in right ventricular wall  thickness.  5. Left atrial size was normal.  6. Right atrial size was normal.  7. The mitral valve is normal in structure. Mild mitral valve  regurgitation. No evidence of mitral stenosis.  8. The tricuspid valve is normal in structure.  9. The tricuspid valve is normal in structure. Tricuspid valve  regurgitation is not demonstrated.  10. The aortic valve is tricuspid. Aortic valve regurgitation is mild.  Mild aortic valve sclerosis without stenosis.  11. The pulmonic valve was normal in structure. Pulmonic valve  regurgitation is not visualized.  12. There is mild dilatation of the ascending aorta measuring 40 mm.  13. The inferior vena cava is  normal in size with greater than 50%  respiratory variability, suggesting right atrial pressure of 3 mmHg.   Coronary CT-A 08/16/18: IMPRESSION: 1. Coronary calcium score of 0. This was 0 percentile for age and sex matched control.  2. Normal coronary origin with right dominance.  3. No significant CAD.  4.  Mild atherosclerosis of the descending aorta.  5. Mild ascending aortic aneurysm unchanged from echocardiogram 06/2018.  Echo 08/2020 1. Ascending aorta measures 39 mm and is stable compared with prior study  (40 mm).  2. Left ventricular ejection fraction, by estimation, is 55 to 60%. The  left ventricle has normal function. The left ventricle has no regional  wall motion abnormalities. There is mild concentric left ventricular  hypertrophy. Left ventricular diastolic  parameters are consistent with Grade I diastolic dysfunction (impaired  relaxation).  3. Right ventricular systolic function is normal. The right ventricular  size is normal. There is normal pulmonary artery systolic pressure. The  estimated right ventricular systolic pressure is 45.8 mmHg.  4. The mitral valve is grossly normal. No evidence of mitral valve  regurgitation. No evidence of mitral stenosis.  5. The aortic valve is tricuspid. There is mild calcification of the  aortic valve. There is mild thickening of the aortic valve. Aortic valve  regurgitation is mild. No aortic stenosis is present.  6. The inferior vena cava is normal in size with greater than 50%  respiratory variability, suggesting right atrial pressure of 3 mmHg.   Recent Labs: No results found for requested labs within last 8760 hours.    Lipid Panel No results found for: CHOL, TRIG, HDL, CHOLHDL, VLDL, LDLCALC, LDLDIRECT    Wt Readings from Last 3 Encounters:  10/01/20 159 lb 9.6 oz (72.4 kg)  08/12/19 161 lb 3.2 oz (73.1 kg)  08/06/18 159 lb 6.4 oz (72.3 kg)      ASSESSMENT AND PLAN:  # Hypertension: BP is  elevated in the office but well controlled at home. He is going to continue  telmisartan to 40 mg .  Check blood pressure at home twice daily and follow up with pharm in 1 month.  He will bring his machine to that appointment.  # Hyperlipidemia:  LDL increased to 124.  He had no coronary calcification on coronary calcium score, but his ASCVD 10-year risk is 34%.  He was started on rosuvastatin and plans to recheck  soon through the Dr. Elana Alm program.  # Mild-moderate aortic regurgitation: # Ascending aorta aneurysm: Ascending aorta was to 3.9 cm in 08/2020 and has been very stable for years. Mild aortic regurgitation. Will repeat in 08/2022  Current medicines are reviewed at length with the patient today.  The patient does not have concerns regarding medicines.  The following changes have been made:  no change  Labs/ tests ordered today include:   Orders Placed This Encounter  Procedures  . EKG 12-Lead     Disposition:   FU with Deedra Pro C. Oval Linsey, MD, [facility] in 1 year.     I,Alexis Bryant,acting as a Education administrator for [place], MD.,have documented all relevant documentation on the behalf of Skeet Latch, MD,as directed by  Skeet Latch, MD while in the presence of Skeet Latch, MD.   Signed, [place]. Oval Linsey, MD, [facility]  10/01/2020 9:57 AM    [facility]

## 2020-10-19 DIAGNOSIS — D224 Melanocytic nevi of scalp and neck: Secondary | ICD-10-CM | POA: Diagnosis not present

## 2020-10-19 DIAGNOSIS — D1722 Benign lipomatous neoplasm of skin and subcutaneous tissue of left arm: Secondary | ICD-10-CM | POA: Diagnosis not present

## 2020-10-19 DIAGNOSIS — D1801 Hemangioma of skin and subcutaneous tissue: Secondary | ICD-10-CM | POA: Diagnosis not present

## 2020-10-19 DIAGNOSIS — Z85828 Personal history of other malignant neoplasm of skin: Secondary | ICD-10-CM | POA: Diagnosis not present

## 2020-10-19 DIAGNOSIS — D225 Melanocytic nevi of trunk: Secondary | ICD-10-CM | POA: Diagnosis not present

## 2020-10-19 DIAGNOSIS — L821 Other seborrheic keratosis: Secondary | ICD-10-CM | POA: Diagnosis not present

## 2020-10-19 DIAGNOSIS — L814 Other melanin hyperpigmentation: Secondary | ICD-10-CM | POA: Diagnosis not present

## 2020-10-19 DIAGNOSIS — L570 Actinic keratosis: Secondary | ICD-10-CM | POA: Diagnosis not present

## 2020-10-19 DIAGNOSIS — D1721 Benign lipomatous neoplasm of skin and subcutaneous tissue of right arm: Secondary | ICD-10-CM | POA: Diagnosis not present

## 2020-10-29 ENCOUNTER — Other Ambulatory Visit: Payer: Self-pay

## 2020-10-29 ENCOUNTER — Ambulatory Visit (INDEPENDENT_AMBULATORY_CARE_PROVIDER_SITE_OTHER): Payer: Medicare HMO | Admitting: Pharmacist

## 2020-10-29 VITALS — BP 178/86 | HR 67 | Resp 16 | Ht 69.0 in | Wt 161.8 lb

## 2020-10-29 DIAGNOSIS — I10 Essential (primary) hypertension: Secondary | ICD-10-CM

## 2020-10-29 MED ORDER — TELMISARTAN 40 MG PO TABS: 20.0000 mg | ORAL_TABLET | Freq: Two times a day (BID) | ORAL | 3 refills | Status: DC

## 2020-10-29 NOTE — Progress Notes (Signed)
Patient ID: Tommy Hubbard                 DOB: 04-Mar-1941                      MRN: [id number]     HPI: Tommy Hubbard is a 80 y.o. male referred by Dr. Oval Linsey to HTN clinic. PMH includes hypertension, aortic regurgitation, aortic aneurysm, and hyperlipidemia. Dr Mare Ferrari retired close to 5 years ago. Currently taking telmisartan 40mg  daily with some BP readings at 885-027 systolic to 741O. He limits sodium intake and all meals are home cooked.    Current HTN meds:  telmisartan 40mg  daily - at bedtime  Intolerance: none  BP goal: 130/80  Family History: family history includes Aortic stenosis in his father; Atrial fibrillation in his father; Breast cancer in his maternal grandmother; Cancer in his father and another family member; Hypertension in an other family memb  Diet: not added sodium, all hoe cooked meals  Social History:  reports that he has never smoked. He has never used smokeless tobacco. He reports current alcohol use. He reports that he does not use drugs  Exercise: plays tennis twice weekly and golf once weekly  Home BP readings:  8 readings, average 134/77 (range 878-676 systolic and 72-09 diastolic), HR range 47-09GGE  Wt Readings from Last 3 Encounters:  10/29/20 161 lb 12.8 oz (73.4 kg)  10/01/20 159 lb 9.6 oz (72.4 kg)  08/12/19 161 lb 3.2 oz (73.1 kg)   BP Readings from Last 3 Encounters:  10/29/20 (!) 178/86  10/01/20 (!) 164/78  08/12/19 (!) 160/80   Pulse Readings from Last 3 Encounters:  10/29/20 67  10/01/20 63  08/12/19 67    Past Medical History:  Diagnosis Date  . Aneurysm of ascending aorta (HCC) 08/06/2018   Mild 06/2018 on echo.  . Aortic regurgitation 08/06/2018   Mild to moderate 06/2018  . Atypical chest pain 08/06/2018  . BPH (benign prostatic hyperplasia)   . Essential hypertension 08/06/2018  . Polio    age 67  . Pure hypercholesterolemia 08/06/2018    Current Outpatient Medications on File Prior to Visit  Medication Sig Dispense  Refill  . Cholecalciferol (VITAMIN D3) 50 MCG (2000 UT) capsule Take by mouth.    . Multiple Vitamins-Minerals (MULTIVITAMIN WITH MINERALS) tablet Take by mouth.    . sildenafil (REVATIO) 20 MG tablet     . tamsulosin (FLOMAX) 0.4 MG CAPS capsule 1 capsule    . rosuvastatin (CRESTOR) 5 MG tablet Take 5 mg by mouth daily. (Patient not taking: Reported on 10/29/2020)     No current facility-administered medications on file prior to visit.    Allergies  Allergen Reactions  . Poison Ivy Extract Rash  . Tetanus Toxoids Rash    Arthus reaction    Blood pressure (!) 178/86, pulse 67, resp. rate 16, height 5\' 9"  (1.753 m), weight 161 lb 12.8 oz (73.4 kg), SpO2 99 %.  Essential hypertension Noted variable BP with most common elevated readings in the afternoon/evening, but readings occasionally down into 366 and 294 systolic.  Will split termisartan dose to 20mg  twice daily to determine if patient will have more even readings throughout the day. Plan to add HCTZ/chlorthaliodne to telmisaran during next OV if BP remains above goal.  Olman Yono Rodriguez-Guzman PharmD, BCPS, [place] [address] [place] [address] 11/03/2020 4:32 PM

## 2020-10-29 NOTE — Patient Instructions (Signed)
Return for a follow up appointment in 4 weeks  Check your blood pressure at home daily (if able) and keep record of the readings.  Take your BP meds as follows: *CHANGE TELMISARTAN DOSE TO 1/2 TABLET EVERY MORNING AND 1/2 TABLET EVERY EVENING*  Bring all of your meds, your BP cuff and your record of home blood pressures to your next appointment.  Exercise as you're able, try to walk approximately 30 minutes per day.  Keep salt intake to a minimum, especially watch canned and prepared boxed foods.  Eat more fresh fruits and vegetables and fewer canned items.  Avoid eating in fast food restaurants.    HOW TO TAKE YOUR BLOOD PRESSURE: . Rest 5 minutes before taking your blood pressure. .  Don't smoke or drink caffeinated beverages for at least 30 minutes before. . Take your blood pressure before (not after) you eat. . Sit comfortably with your back supported and both feet on the floor (don't cross your legs). . Elevate your arm to heart level on a table or a desk. . Use the proper sized cuff. It should fit smoothly and snugly around your bare upper arm. There should be enough room to slip a fingertip under the cuff. The bottom edge of the cuff should be 1 inch above the crease of the elbow. . Ideally, take 3 measurements at one sitting and record the average.

## 2020-11-03 ENCOUNTER — Encounter: Payer: Self-pay | Admitting: Pharmacist

## 2020-11-03 NOTE — Assessment & Plan Note (Signed)
Noted variable BP with most common elevated readings in the afternoon/evening, but readings occasionally down into 716 and 967 systolic.  Will split termisartan dose to 20mg  twice daily to determine if patient will have more even readings throughout the day. Plan to add HCTZ/chlorthaliodne to telmisaran during next OV if BP remains above goal.

## 2020-11-26 ENCOUNTER — Ambulatory Visit (INDEPENDENT_AMBULATORY_CARE_PROVIDER_SITE_OTHER): Payer: Medicare HMO | Admitting: Pharmacist Clinician (PhC)/ Clinical Pharmacy Specialist

## 2020-11-26 ENCOUNTER — Other Ambulatory Visit: Payer: Self-pay

## 2020-11-26 DIAGNOSIS — I10 Essential (primary) hypertension: Secondary | ICD-10-CM

## 2020-11-26 NOTE — Progress Notes (Signed)
Patient ID: Tommy Hubbard                 DOB: [birth year]                      MRN: [id number]     HPI: Tommy Hubbard is a 80 y.o. male referred by Dr. Oval Linsey to HTN clinic. PMH includes hypertension, aortic regurgitation, aortic aneurysm, and hyperlipidemia. Dr Mare Ferrari retired close to 5 years ago. Currently taking telmisartan 40mg  daily with some BP readings at 169-678 systolic to 938B. He limits sodium intake and all meals are home cooked.  At his last visit his telmisartan was split to 20 mg twice daily in hopes of decreasing the wide swing in systolic readings.   Today he returns for follow up.  Over the last 3 weeks he has checked his home pressure about 7 times, with the highest being at 147, lowest at 107.  He is feeling well overall.  Notes that he has not been exercising as much recently, as they are moving in 10 days and he spends much of his time packing things up.    Current HTN meds:  telmisartan 20 mg bid  Intolerance: none  BP goal: 130/80  Family History: family history includes Aortic stenosis in his father; Atrial fibrillation in his father; Breast cancer in his maternal grandmother; Cancer in his father and another family member; Hypertension in an other family member  Diet: no added sodium, all home cooked meals; drinks equivalent of 1 cup caffeinated coffee in the mornings  Social History:  reports that he has never smoked. He has never used smokeless tobacco. He reports current alcohol use. He reports that he does not use drugs  Exercise: plays tennis twice weekly and golf once weekly  Home BP readings: has home Omron cuff, read within 5 points of office reading 7 readings, average 130/74 (range 017-510 systolic and 25-85 diastolic), HR  8 readings, average 134/77 (range 277-824 systolic and 23-53 diastolic), HR range 61-44RXV  Labs:  4/22 Na 143, K 4.5, Glu 88, BUN 19, SCr 0.96  Wt Readings from Last 3 Encounters:  11/26/20 158 lb 6.4 oz (71.8 kg)   10/29/20 161 lb 12.8 oz (73.4 kg)  10/01/20 159 lb 9.6 oz (72.4 kg)   BP Readings from Last 3 Encounters:  11/26/20 140/76  10/29/20 (!) 178/86  10/01/20 (!) 164/78   Pulse Readings from Last 3 Encounters:  11/26/20 85  10/29/20 67  10/01/20 63    Past Medical History:  Diagnosis Date  . Aneurysm of ascending aorta (HCC) 08/06/2018   Mild 06/2018 on echo.  . Aortic regurgitation 08/06/2018   Mild to moderate 06/2018  . Atypical chest pain 08/06/2018  . BPH (benign prostatic hyperplasia)   . Essential hypertension 08/06/2018  . Polio    age 82  . Pure hypercholesterolemia 08/06/2018    Current Outpatient Medications on File Prior to Visit  Medication Sig Dispense Refill  . Cholecalciferol (VITAMIN D3) 50 MCG (2000 UT) capsule Take by mouth.    . Multiple Vitamins-Minerals (MULTIVITAMIN WITH MINERALS) tablet Take by mouth.    . rosuvastatin (CRESTOR) 5 MG tablet Take 5 mg by mouth daily.    . sildenafil (REVATIO) 20 MG tablet     . tamsulosin (FLOMAX) 0.4 MG CAPS capsule 1 capsule    . telmisartan (MICARDIS) 40 MG tablet Take 0.5 tablets (20 mg total) by mouth in the morning and at bedtime. 90 tablet 3  No current facility-administered medications on file prior to visit.    Allergies  Allergen Reactions  . Rosuvastatin Other (See Comments)  . Poison Ivy Extract Rash  . Tetanus Toxoids Rash    Arthus reaction    Blood pressure 140/76, pulse 85, resp. rate 16, height 5\' 9"  (1.753 m), weight 158 lb 6.4 oz (71.8 kg), SpO2 99 %.  Essential hypertension Patient with essential hypertension, doing much better since dividing telmisartan to twice daily dosing.  Reviewed goal information with patient and agreed that we will not start any new medications at this time.  He is to continue with home monitoring 2-3 times each week (although he should wait until after he moves to check again), and he can reach out to the office with any concerns or questions.   Tommy Medal PharmD CPP  [facility] [address] [place] [address] 11/26/2020 10:32 AM

## 2020-11-26 NOTE — Assessment & Plan Note (Signed)
Patient with essential hypertension, doing much better since dividing telmisartan to twice daily dosing.  Reviewed goal information with patient and agreed that we will not start any new medications at this time.  He is to continue with home monitoring 2-3 times each week (although he should wait until after he moves to check again), and he can reach out to the office with any concerns or questions.

## 2020-11-26 NOTE — Patient Instructions (Signed)
Call if you notice home BP readings trending upward again.    Check your blood pressure at home daily 2-3 times each week and keep record of the readings.  (wait until after you move to check it again!) Take your BP meds as follows:   Continue with telmisartan 20 mg twice daily  Bring all of your meds, your BP cuff and your record of home blood pressures to your next appointment.  Exercise as you're able, try to walk approximately 30 minutes per day.  Keep salt intake to a minimum, especially watch canned and prepared boxed foods.  Eat more fresh fruits and vegetables and fewer canned items.  Avoid eating in fast food restaurants.    HOW TO TAKE YOUR BLOOD PRESSURE: . Rest 5 minutes before taking your blood pressure. .  Don't smoke or drink caffeinated beverages for at least 30 minutes before. . Take your blood pressure before (not after) you eat. . Sit comfortably with your back supported and both feet on the floor (don't cross your legs). . Elevate your arm to heart level on a table or a desk. . Use the proper sized cuff. It should fit smoothly and snugly around your bare upper arm. There should be enough room to slip a fingertip under the cuff. The bottom edge of the cuff should be 1 inch above the crease of the elbow. . Ideally, take 3 measurements at one sitting and record the average.

## 2021-01-21 DIAGNOSIS — Z85828 Personal history of other malignant neoplasm of skin: Secondary | ICD-10-CM | POA: Diagnosis not present

## 2021-01-21 DIAGNOSIS — L821 Other seborrheic keratosis: Secondary | ICD-10-CM | POA: Diagnosis not present

## 2021-01-21 DIAGNOSIS — L570 Actinic keratosis: Secondary | ICD-10-CM | POA: Diagnosis not present

## 2021-02-25 DIAGNOSIS — R946 Abnormal results of thyroid function studies: Secondary | ICD-10-CM | POA: Diagnosis not present

## 2021-02-25 DIAGNOSIS — L659 Nonscarring hair loss, unspecified: Secondary | ICD-10-CM | POA: Diagnosis not present

## 2021-02-25 DIAGNOSIS — E785 Hyperlipidemia, unspecified: Secondary | ICD-10-CM | POA: Diagnosis not present

## 2021-03-01 DIAGNOSIS — Z85828 Personal history of other malignant neoplasm of skin: Secondary | ICD-10-CM | POA: Diagnosis not present

## 2021-03-01 DIAGNOSIS — D224 Melanocytic nevi of scalp and neck: Secondary | ICD-10-CM | POA: Diagnosis not present

## 2021-03-01 DIAGNOSIS — L650 Telogen effluvium: Secondary | ICD-10-CM | POA: Diagnosis not present

## 2021-04-10 DIAGNOSIS — Z23 Encounter for immunization: Secondary | ICD-10-CM | POA: Diagnosis not present

## 2021-07-15 DIAGNOSIS — Z961 Presence of intraocular lens: Secondary | ICD-10-CM | POA: Diagnosis not present

## 2021-07-15 DIAGNOSIS — H26491 Other secondary cataract, right eye: Secondary | ICD-10-CM | POA: Diagnosis not present

## 2021-08-04 DIAGNOSIS — H26492 Other secondary cataract, left eye: Secondary | ICD-10-CM | POA: Diagnosis not present

## 2021-09-02 DIAGNOSIS — D1801 Hemangioma of skin and subcutaneous tissue: Secondary | ICD-10-CM | POA: Diagnosis not present

## 2021-09-02 DIAGNOSIS — L570 Actinic keratosis: Secondary | ICD-10-CM | POA: Diagnosis not present

## 2021-09-02 DIAGNOSIS — D1721 Benign lipomatous neoplasm of skin and subcutaneous tissue of right arm: Secondary | ICD-10-CM | POA: Diagnosis not present

## 2021-09-02 DIAGNOSIS — L820 Inflamed seborrheic keratosis: Secondary | ICD-10-CM | POA: Diagnosis not present

## 2021-09-02 DIAGNOSIS — Z85828 Personal history of other malignant neoplasm of skin: Secondary | ICD-10-CM | POA: Diagnosis not present

## 2021-09-02 DIAGNOSIS — L821 Other seborrheic keratosis: Secondary | ICD-10-CM | POA: Diagnosis not present

## 2021-09-02 DIAGNOSIS — D225 Melanocytic nevi of trunk: Secondary | ICD-10-CM | POA: Diagnosis not present

## 2021-09-13 ENCOUNTER — Other Ambulatory Visit (HOSPITAL_BASED_OUTPATIENT_CLINIC_OR_DEPARTMENT_OTHER): Payer: Self-pay | Admitting: *Deleted

## 2021-09-13 DIAGNOSIS — I10 Essential (primary) hypertension: Secondary | ICD-10-CM

## 2021-09-13 DIAGNOSIS — I351 Nonrheumatic aortic (valve) insufficiency: Secondary | ICD-10-CM

## 2021-09-16 DIAGNOSIS — M859 Disorder of bone density and structure, unspecified: Secondary | ICD-10-CM | POA: Diagnosis not present

## 2021-09-16 DIAGNOSIS — I10 Essential (primary) hypertension: Secondary | ICD-10-CM | POA: Diagnosis not present

## 2021-09-16 DIAGNOSIS — E785 Hyperlipidemia, unspecified: Secondary | ICD-10-CM | POA: Diagnosis not present

## 2021-09-16 DIAGNOSIS — Z125 Encounter for screening for malignant neoplasm of prostate: Secondary | ICD-10-CM | POA: Diagnosis not present

## 2021-09-23 DIAGNOSIS — N401 Enlarged prostate with lower urinary tract symptoms: Secondary | ICD-10-CM | POA: Diagnosis not present

## 2021-09-23 DIAGNOSIS — R946 Abnormal results of thyroid function studies: Secondary | ICD-10-CM | POA: Diagnosis not present

## 2021-09-23 DIAGNOSIS — R011 Cardiac murmur, unspecified: Secondary | ICD-10-CM | POA: Diagnosis not present

## 2021-09-23 DIAGNOSIS — R82998 Other abnormal findings in urine: Secondary | ICD-10-CM | POA: Diagnosis not present

## 2021-09-23 DIAGNOSIS — M8580 Other specified disorders of bone density and structure, unspecified site: Secondary | ICD-10-CM | POA: Diagnosis not present

## 2021-09-23 DIAGNOSIS — I10 Essential (primary) hypertension: Secondary | ICD-10-CM | POA: Diagnosis not present

## 2021-09-23 DIAGNOSIS — Z23 Encounter for immunization: Secondary | ICD-10-CM | POA: Diagnosis not present

## 2021-09-23 DIAGNOSIS — I7090 Unspecified atherosclerosis: Secondary | ICD-10-CM | POA: Diagnosis not present

## 2021-09-23 DIAGNOSIS — E785 Hyperlipidemia, unspecified: Secondary | ICD-10-CM | POA: Diagnosis not present

## 2021-09-23 DIAGNOSIS — Z0000 Encounter for general adult medical examination without abnormal findings: Secondary | ICD-10-CM | POA: Diagnosis not present

## 2021-10-08 DIAGNOSIS — Z1211 Encounter for screening for malignant neoplasm of colon: Secondary | ICD-10-CM | POA: Diagnosis not present

## 2021-12-08 ENCOUNTER — Ambulatory Visit (HOSPITAL_BASED_OUTPATIENT_CLINIC_OR_DEPARTMENT_OTHER): Payer: Medicare HMO | Admitting: Cardiovascular Disease

## 2021-12-17 ENCOUNTER — Encounter (HOSPITAL_BASED_OUTPATIENT_CLINIC_OR_DEPARTMENT_OTHER): Payer: Self-pay | Admitting: Cardiovascular Disease

## 2021-12-17 ENCOUNTER — Ambulatory Visit (HOSPITAL_BASED_OUTPATIENT_CLINIC_OR_DEPARTMENT_OTHER): Payer: Medicare HMO | Admitting: Cardiovascular Disease

## 2021-12-17 VITALS — BP 128/70 | HR 75 | Ht 69.0 in | Wt 161.2 lb

## 2021-12-17 DIAGNOSIS — E7800 Pure hypercholesterolemia, unspecified: Secondary | ICD-10-CM | POA: Diagnosis not present

## 2021-12-17 DIAGNOSIS — I351 Nonrheumatic aortic (valve) insufficiency: Secondary | ICD-10-CM | POA: Diagnosis not present

## 2021-12-17 DIAGNOSIS — I7121 Aneurysm of the ascending aorta, without rupture: Secondary | ICD-10-CM | POA: Diagnosis not present

## 2021-12-17 NOTE — Assessment & Plan Note (Signed)
Blood pressure has been much better controlled since he started switching telmisartan to twice daily.  Continue current regimen.  He will check his blood pressures intermittently at home at least a couple times per month.  Keep working on diet and exercise.

## 2021-12-17 NOTE — Assessment & Plan Note (Signed)
Stable at 3.9 to 4.0 cm on repeat imaging.  We will repeat echo in 1 year.  BP now well-controlled.  Consider beta blocker if we need an additional agent.

## 2021-12-17 NOTE — Assessment & Plan Note (Signed)
Coronary CTA showed no CAD in 2020.  Lipids improved with diet and exercise.  He had an LP(a) checked with his PCP and was reportedly low risk.  Repeat coronary calcium score in 2025.  For now, keep working on diet and exercise and he does not need to be on a statin.

## 2021-12-17 NOTE — Progress Notes (Signed)
Cardiology Office Note   Date:  12/17/2021   ID:  Tommy Hubbard, DOB 1941-05-01, MRN [id number]  PCP:  Crist Infante, MD  Cardiologist:   Skeet Latch, MD   No chief complaint on file.    History of Present Illness: Tommy Hubbard is a 81 y.o. male with hypertension, mild-moderate aortic regurgitation, mild aortic aneurysm, and hyperlipidemia here for follow up.  He was initially seen 08/06/18 for the evaluation of chest discomfort.  He reported intermittent episodes of chest discomfort and mild exertional dyspnea.  He was referred for coronary CT-A 08/2018 that revealed a coronary calcium score of 0.  There was mild atherosclerosis of the descending aorta.  There is a mild ascending aorta unchanged from his previous echo.  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  He saw his PCP, Dr. Joylene Draft and his blood pressure was noted to be elevated.  He was started on valsartan.  After starting this medication he wore an ambulatory monitor that showed his blood pressure was 118/65 on average.  Dr. Joylene Draft noted a murmur on exam.  He had an echo 06/2018 that revealed LVEF 55 to 60% with grade 1 diastolic dysfunction.  He had mild calcification of the aortic valve with mild to moderate regurgitation. Prior to this appointment he had a repeat echocardiogram.   At his last appointment he had intermittent low blood pressures but was otherwise doing well. He tracked his blood pressure at home and followed up with our pharmacist 10/2020 and his blood pressure was 178/86. His telmisartan was split to BID, and on follow up the next month his blood pressure was 140/76. No changes were made and he planned to increase his exercise. Today, he is feeling well. Earlier this morning his blood pressure was 144/84 when he first sat down, and improved to 130/70 on recheck. He denies any dizzy spells, chest pains, or shortness of breath. For exercise he is  playing tennis (1.5 hours) and golf a couple times a week, and participates in some exercise classes. He is no longer taking rosuvastatin; he states that he never really took it much initially. Recently he had Lpa testing which was reportedly good. He denies any palpitations, or peripheral edema. No lightheadedness, headaches, syncope, orthopnea, or PND.   Past Medical History:  Diagnosis Date   Aneurysm of ascending aorta ([place]) 08/06/2018   Mild 06/2018 on echo.   Aortic regurgitation 08/06/2018   Mild to moderate 06/2018   Atypical chest pain 08/06/2018   BPH (benign prostatic hyperplasia)    Essential hypertension 08/06/2018   Polio    age 41   Pure hypercholesterolemia 08/06/2018    Past Surgical History:  Procedure Laterality Date   ADENOIDECTOMY     CATARACT EXTRACTION, BILATERAL     INGUINAL HERNIA REPAIR Right 11/19/2012   Procedure: HERNIA REPAIR INGUINAL ADULT with mesh;  Surgeon: Shann Medal, MD;  Location: [facility];  Service: General;  Laterality: Right;   INSERTION OF MESH Right 11/19/2012   Procedure: INSERTION OF MESH;  Surgeon: Shann Medal, MD;  Location: [facility];  Service: General;  Laterality: Right;   ROTATOR CUFF REPAIR  2016   TONSILLECTOMY       Current Outpatient Medications  Medication Sig Dispense Refill   Cholecalciferol (VITAMIN D3) 50 MCG (2000 UT) capsule Take by mouth.     Multiple Vitamins-Minerals (MULTIVITAMIN WITH MINERALS) tablet Take by mouth.     sildenafil (REVATIO)  20 MG tablet      tamsulosin (FLOMAX) 0.4 MG CAPS capsule 1 capsule     telmisartan (MICARDIS) 40 MG tablet Take 0.5 tablets (20 mg total) by mouth in the morning and at bedtime. 90 tablet 3   No current facility-administered medications for this visit.    Allergies:   Rosuvastatin, Poison ivy extract, and Tetanus toxoids    Social History:  The patient  reports that he has never smoked. He has never used smokeless tobacco. He reports current alcohol use. He reports that he does not  use drugs.   Family History:  The patient's family history includes Aortic stenosis in his father; Atrial fibrillation in his father; Breast cancer in his maternal grandmother; Cancer in his father and another family member; Hypertension in an other family member.    ROS:   Please see the history of present illness. All other systems are reviewed and negative.    PHYSICAL EXAM: VS:  BP 128/70 (BP Location: Left Arm, Patient Position: Sitting, Cuff Size: Normal)   Pulse 75   Ht '5\' 9"'$  (1.753 m)   Wt 161 lb 3.2 oz (73.1 kg)   BMI 23.81 kg/m  , BMI Body mass index is 23.81 kg/m. GENERAL:  Well appearing HEENT: Pupils equal round and reactive, fundi not visualized, oral mucosa unremarkable NECK:  No jugular venous distention, waveform within normal limits, carotid upstroke brisk and symmetric, no bruits LUNGS:  Clear to auscultation bilaterally HEART:  RRR.  PMI not displaced or sustained,S1 and S2 within normal limits, no S3, no S4, no clicks, no rubs, no murmurs ABD:  Flat, positive bowel sounds normal in frequency in pitch, no bruits, no rebound, no guarding, no midline pulsatile mass, no hepatomegaly, no splenomegaly EXT:  2 plus pulses throughout, no edema, no cyanosis no clubbing SKIN:  No rashes no nodules NEURO:  Cranial nerves II through XII grossly intact, motor grossly intact throughout PSYCH:  Cognitively intact, oriented to person place and time   EKG:   EKG is personally reviewed. 12/17/2021:  Sinus rhythm. RBBB. LAFB. 10/01/20 Sinus rhythm. RBBB. Rate:62 bpm. LAFB.  08/12/19: Sinus rhythm.  RBBB.  Rate 67 bpm. LAFB. 08/06/2018: sinus rhythm.  Rate 76 bpm.  LAFB.  Non-specific IVCD.     Echo 08/2020 1. Ascending aorta measures 39 mm and is stable compared with prior study  (40 mm).   2. Left ventricular ejection fraction, by estimation, is 55 to 60%. The  left ventricle has normal function. The left ventricle has no regional  wall motion abnormalities. There is mild  concentric left ventricular  hypertrophy. Left ventricular diastolic  parameters are consistent with Grade I diastolic dysfunction (impaired  relaxation).   3. Right ventricular systolic function is normal. The right ventricular  size is normal. There is normal pulmonary artery systolic pressure. The  estimated right ventricular systolic pressure is 10.6 mmHg.   4. The mitral valve is grossly normal. No evidence of mitral valve  regurgitation. No evidence of mitral stenosis.   5. The aortic valve is tricuspid. There is mild calcification of the  aortic valve. There is mild thickening of the aortic valve. Aortic valve  regurgitation is mild. No aortic stenosis is present.   6. The inferior vena cava is normal in size with greater than 50%  respiratory variability, suggesting right atrial pressure of 3 mmHg.    Echo 08/07/19: IMPRESSIONS    1. Left ventricular ejection fraction, by visual estimation, is 65 to  70%. The  left ventricle has hyperdynamic function. There is moderately  increased left ventricular hypertrophy.   2. Left ventricular diastolic parameters are consistent with Grade I  diastolic dysfunction (impaired relaxation).   3. The left ventricle has no regional wall motion abnormalities.   4. Global right ventricle has normal systolic function.The right  ventricular size is normal. No increase in right ventricular wall  thickness.   5. Left atrial size was normal.   6. Right atrial size was normal.   7. The mitral valve is normal in structure. Mild mitral valve  regurgitation. No evidence of mitral stenosis.   8. The tricuspid valve is normal in structure.   9. The tricuspid valve is normal in structure. Tricuspid valve  regurgitation is not demonstrated.  10. The aortic valve is tricuspid. Aortic valve regurgitation is mild.  Mild aortic valve sclerosis without stenosis.  11. The pulmonic valve was normal in structure. Pulmonic valve  regurgitation is not visualized.   12. There is mild dilatation of the ascending aorta measuring 40 mm.  13. The inferior vena cava is normal in size with greater than 50%  respiratory variability, suggesting right atrial pressure of 3 mmHg.   Coronary CT-A 08/16/18: IMPRESSION: 1. Coronary calcium score of 0. This was 0 percentile for age and sex matched control.   2. Normal coronary origin with right dominance.   3. No significant CAD.   4.  Mild atherosclerosis of the descending aorta.   5. Mild ascending aortic aneurysm unchanged from echocardiogram 06/2018.  Echo 06/04/18: Study Conclusions   - Left ventricle: The cavity size was normal. Wall thickness was   increased in a pattern of mild LVH. Systolic function was normal.   The estimated ejection fraction was in the range of 55% to 60%.   Wall motion was normal; there were no regional wall motion   abnormalities. Doppler parameters are consistent with abnormal   left ventricular relaxation (grade 1 diastolic dysfunction). - Aortic valve: Trileaflet; mildly calcified leaflets. There was no   stenosis. There was mild to moderate regurgitation. - Ascending aorta: The ascending aorta was mildly dilated at 38 mm. - Mitral valve: Mildly calcified annulus. - Right ventricle: The cavity size was normal. Systolic function   was normal. - Pulmonary arteries: No complete TR doppler jet so unable to   estimate PA systolic pressure. - Inferior vena cava: The vessel was normal in size. The   respirophasic diameter changes were in the normal range (= 50%),   consistent with normal central venous pressure.   Impressions:   - Normal LV size with mild LV hypertrophy. EF 55-60%. Normal RV   size and systolic function. Mild to moderate aortic   insufficiency.   Recent Labs: No results found for requested labs within last 365 days.    Lipid Panel No results found for: "CHOL", "TRIG", "HDL", "CHOLHDL", "VLDL", "LDLCALC", "LDLDIRECT"    Wt Readings from Last 3  Encounters:  12/17/21 161 lb 3.2 oz (73.1 kg)  11/26/20 158 lb 6.4 oz (71.8 kg)  10/29/20 161 lb 12.8 oz (73.4 kg)      ASSESSMENT AND PLAN:  Aneurysm of ascending aorta ([facility]) Stable at 3.9 to 4.0 cm on repeat imaging.  We will repeat echo in 1 year.  BP now well-controlled.  Consider beta blocker if we need an additional agent.   Essential hypertension Blood pressure has been much better controlled since he started switching telmisartan to twice daily.  Continue current regimen.  He will check  his blood pressures intermittently at home at least a couple times per month.  Keep working on diet and exercise.  Pure hypercholesterolemia Coronary CTA showed no CAD in 2020.  Lipids improved with diet and exercise.  He had an LP(a) checked with his PCP and was reportedly low risk.  Repeat coronary calcium score in 2025.  For now, keep working on diet and exercise and he does not need to be on a statin.  Aortic regurgitation Previously mild to moderate.  Mild on his most recent echo in 2020.  He has no heart failure symptoms and is exercising regularly.  Current medicines are reviewed at length with the patient today.  The patient does not have concerns regarding medicines.  The following changes have been made:  no change  Labs/ tests ordered today include:   Orders Placed This Encounter  Procedures   EKG 12-Lead   ECHOCARDIOGRAM COMPLETE     Disposition:   FU with Micco Bourbeau C. Oval Linsey, MD, [facility] in 1 year following repeat Echo.     I,Mathew Stumpf,acting as a Education administrator for Skeet Latch, MD.,have documented all relevant documentation on the behalf of Skeet Latch, MD,as directed by  Skeet Latch, MD while in the presence of Skeet Latch, MD.  I, [place] Oval Linsey, MD have reviewed all documentation for this visit.  The documentation of the exam, diagnosis, procedures, and orders on 12/17/2021 are all accurate and complete.   Signed, Joseangel Nettleton C. Oval Linsey, MD, [facility]   12/17/2021 5:28 PM    [place]

## 2021-12-17 NOTE — Patient Instructions (Addendum)
Medication Instructions:  Your physician recommends that you continue on your current medications as directed. Please refer to the Current Medication list given to you today.   *If you need a refill on your cardiac medications before your next appointment, please call your pharmacy*  Lab Work: NONE  Testing/Procedures: Your physician has requested that you have an echocardiogram. Echocardiography is a painless test that uses sound waves to create images of your heart. It provides your doctor with information about the size and shape of your heart and how well your heart's chambers and valves are working. This procedure takes approximately one hour. There are no restrictions for this procedure.  TO BE DONE IN 1 YEAR   Follow-Up:  AFTER YOU HAVE YOUR ECHO IN 1 YEAR

## 2021-12-17 NOTE — Assessment & Plan Note (Signed)
Previously mild to moderate.  Mild on his most recent echo in 2020.  He has no heart failure symptoms and is exercising regularly.

## 2021-12-20 ENCOUNTER — Telehealth (HOSPITAL_BASED_OUTPATIENT_CLINIC_OR_DEPARTMENT_OTHER): Payer: Self-pay | Admitting: Cardiovascular Disease

## 2021-12-20 NOTE — Telephone Encounter (Signed)
Left message for patient to call and schedule the Echocardiogram ordered by Dr. [place] 

## 2021-12-23 DIAGNOSIS — N529 Male erectile dysfunction, unspecified: Secondary | ICD-10-CM | POA: Diagnosis not present

## 2021-12-23 DIAGNOSIS — N401 Enlarged prostate with lower urinary tract symptoms: Secondary | ICD-10-CM | POA: Diagnosis not present

## 2021-12-23 DIAGNOSIS — N138 Other obstructive and reflux uropathy: Secondary | ICD-10-CM | POA: Diagnosis not present

## 2022-02-01 DIAGNOSIS — Z961 Presence of intraocular lens: Secondary | ICD-10-CM | POA: Diagnosis not present

## 2022-04-23 DIAGNOSIS — Z23 Encounter for immunization: Secondary | ICD-10-CM | POA: Diagnosis not present

## 2022-05-09 ENCOUNTER — Ambulatory Visit
Admission: RE | Admit: 2022-05-09 | Discharge: 2022-05-09 | Disposition: A | Discharge status: Home / Self Care | Payer: Medicare HMO | Source: Ambulatory Visit | Attending: Orthopedic Surgery | Admitting: Orthopedic Surgery

## 2022-05-09 ENCOUNTER — Other Ambulatory Visit: Payer: Self-pay | Admitting: Orthopedic Surgery

## 2022-05-09 DIAGNOSIS — S065X0A Traumatic subdural hemorrhage without loss of consciousness, initial encounter: Secondary | ICD-10-CM

## 2022-05-09 DIAGNOSIS — R519 Headache, unspecified: Secondary | ICD-10-CM | POA: Diagnosis not present

## 2022-06-13 DIAGNOSIS — Z20818 Contact with and (suspected) exposure to other bacterial communicable diseases: Secondary | ICD-10-CM | POA: Diagnosis not present

## 2022-07-07 DIAGNOSIS — L821 Other seborrheic keratosis: Secondary | ICD-10-CM | POA: Diagnosis not present

## 2022-07-07 DIAGNOSIS — L570 Actinic keratosis: Secondary | ICD-10-CM | POA: Diagnosis not present

## 2022-07-07 DIAGNOSIS — Z85828 Personal history of other malignant neoplasm of skin: Secondary | ICD-10-CM | POA: Diagnosis not present

## 2022-10-13 DIAGNOSIS — D1722 Benign lipomatous neoplasm of skin and subcutaneous tissue of left arm: Secondary | ICD-10-CM | POA: Diagnosis not present

## 2022-10-13 DIAGNOSIS — L814 Other melanin hyperpigmentation: Secondary | ICD-10-CM | POA: Diagnosis not present

## 2022-10-13 DIAGNOSIS — D224 Melanocytic nevi of scalp and neck: Secondary | ICD-10-CM | POA: Diagnosis not present

## 2022-10-13 DIAGNOSIS — L570 Actinic keratosis: Secondary | ICD-10-CM | POA: Diagnosis not present

## 2022-10-13 DIAGNOSIS — L918 Other hypertrophic disorders of the skin: Secondary | ICD-10-CM | POA: Diagnosis not present

## 2022-10-13 DIAGNOSIS — D225 Melanocytic nevi of trunk: Secondary | ICD-10-CM | POA: Diagnosis not present

## 2022-10-13 DIAGNOSIS — D1721 Benign lipomatous neoplasm of skin and subcutaneous tissue of right arm: Secondary | ICD-10-CM | POA: Diagnosis not present

## 2022-10-13 DIAGNOSIS — L820 Inflamed seborrheic keratosis: Secondary | ICD-10-CM | POA: Diagnosis not present

## 2022-10-13 DIAGNOSIS — D1801 Hemangioma of skin and subcutaneous tissue: Secondary | ICD-10-CM | POA: Diagnosis not present

## 2022-10-13 DIAGNOSIS — Z85828 Personal history of other malignant neoplasm of skin: Secondary | ICD-10-CM | POA: Diagnosis not present

## 2022-10-13 DIAGNOSIS — L821 Other seborrheic keratosis: Secondary | ICD-10-CM | POA: Diagnosis not present

## 2022-10-13 DIAGNOSIS — C44319 Basal cell carcinoma of skin of other parts of face: Secondary | ICD-10-CM | POA: Diagnosis not present

## 2022-10-14 ENCOUNTER — Other Ambulatory Visit (HOSPITAL_BASED_OUTPATIENT_CLINIC_OR_DEPARTMENT_OTHER): Payer: Self-pay | Admitting: *Deleted

## 2022-10-14 DIAGNOSIS — I351 Nonrheumatic aortic (valve) insufficiency: Secondary | ICD-10-CM

## 2022-10-14 DIAGNOSIS — I7121 Aneurysm of the ascending aorta, without rupture: Secondary | ICD-10-CM

## 2022-10-31 DIAGNOSIS — M8589 Other specified disorders of bone density and structure, multiple sites: Secondary | ICD-10-CM | POA: Diagnosis not present

## 2022-10-31 DIAGNOSIS — I10 Essential (primary) hypertension: Secondary | ICD-10-CM | POA: Diagnosis not present

## 2022-10-31 DIAGNOSIS — R946 Abnormal results of thyroid function studies: Secondary | ICD-10-CM | POA: Diagnosis not present

## 2022-10-31 DIAGNOSIS — Z125 Encounter for screening for malignant neoplasm of prostate: Secondary | ICD-10-CM | POA: Diagnosis not present

## 2022-10-31 DIAGNOSIS — M859 Disorder of bone density and structure, unspecified: Secondary | ICD-10-CM | POA: Diagnosis not present

## 2022-10-31 DIAGNOSIS — E785 Hyperlipidemia, unspecified: Secondary | ICD-10-CM | POA: Diagnosis not present

## 2022-11-02 DIAGNOSIS — Z0000 Encounter for general adult medical examination without abnormal findings: Secondary | ICD-10-CM | POA: Diagnosis not present

## 2022-11-02 DIAGNOSIS — E785 Hyperlipidemia, unspecified: Secondary | ICD-10-CM | POA: Diagnosis not present

## 2022-11-02 DIAGNOSIS — R011 Cardiac murmur, unspecified: Secondary | ICD-10-CM | POA: Diagnosis not present

## 2022-11-02 DIAGNOSIS — I10 Essential (primary) hypertension: Secondary | ICD-10-CM | POA: Diagnosis not present

## 2022-11-02 DIAGNOSIS — M8580 Other specified disorders of bone density and structure, unspecified site: Secondary | ICD-10-CM | POA: Diagnosis not present

## 2022-11-02 DIAGNOSIS — I7090 Unspecified atherosclerosis: Secondary | ICD-10-CM | POA: Diagnosis not present

## 2022-11-02 DIAGNOSIS — G720 Drug-induced myopathy: Secondary | ICD-10-CM | POA: Diagnosis not present

## 2022-11-02 DIAGNOSIS — N401 Enlarged prostate with lower urinary tract symptoms: Secondary | ICD-10-CM | POA: Diagnosis not present

## 2022-11-02 DIAGNOSIS — C4491 Basal cell carcinoma of skin, unspecified: Secondary | ICD-10-CM | POA: Diagnosis not present

## 2022-12-27 ENCOUNTER — Ambulatory Visit (INDEPENDENT_AMBULATORY_CARE_PROVIDER_SITE_OTHER): Payer: Medicare HMO

## 2022-12-27 DIAGNOSIS — I77810 Thoracic aortic ectasia: Secondary | ICD-10-CM | POA: Diagnosis not present

## 2022-12-27 DIAGNOSIS — I351 Nonrheumatic aortic (valve) insufficiency: Secondary | ICD-10-CM

## 2022-12-27 DIAGNOSIS — I7121 Aneurysm of the ascending aorta, without rupture: Secondary | ICD-10-CM | POA: Diagnosis not present

## 2022-12-28 ENCOUNTER — Other Ambulatory Visit (HOSPITAL_BASED_OUTPATIENT_CLINIC_OR_DEPARTMENT_OTHER): Payer: Medicare HMO

## 2022-12-29 DIAGNOSIS — N401 Enlarged prostate with lower urinary tract symptoms: Secondary | ICD-10-CM | POA: Diagnosis not present

## 2022-12-29 DIAGNOSIS — N529 Male erectile dysfunction, unspecified: Secondary | ICD-10-CM | POA: Diagnosis not present

## 2022-12-29 DIAGNOSIS — N138 Other obstructive and reflux uropathy: Secondary | ICD-10-CM | POA: Diagnosis not present

## 2022-12-30 LAB — ECHOCARDIOGRAM COMPLETE
AV Vena cont: 0.22 cm
Area-P 1/2: 3.03 cm2
P 1/2 time: 649 msec
S' Lateral: 3.06 cm

## 2023-01-16 ENCOUNTER — Encounter (HOSPITAL_BASED_OUTPATIENT_CLINIC_OR_DEPARTMENT_OTHER): Payer: Self-pay | Admitting: Cardiovascular Disease

## 2023-01-16 ENCOUNTER — Ambulatory Visit (HOSPITAL_BASED_OUTPATIENT_CLINIC_OR_DEPARTMENT_OTHER): Payer: Medicare HMO | Admitting: Cardiovascular Disease

## 2023-01-16 VITALS — BP 148/64 | HR 62 | Ht 69.0 in | Wt 160.3 lb

## 2023-01-16 DIAGNOSIS — I351 Nonrheumatic aortic (valve) insufficiency: Secondary | ICD-10-CM | POA: Diagnosis not present

## 2023-01-16 DIAGNOSIS — I10 Essential (primary) hypertension: Secondary | ICD-10-CM | POA: Diagnosis not present

## 2023-01-16 DIAGNOSIS — I7121 Aneurysm of the ascending aorta, without rupture: Secondary | ICD-10-CM | POA: Diagnosis not present

## 2023-01-16 DIAGNOSIS — E7800 Pure hypercholesterolemia, unspecified: Secondary | ICD-10-CM

## 2023-01-16 NOTE — Patient Instructions (Signed)
Medication Instructions:  Your physician recommends that you continue on your current medications as directed. Please refer to the Current Medication list given to you today.  *If you need a refill on your cardiac medications before your next appointment, please call your pharmacy*   Testing/Procedures: Your physician has requested that you have an echocardiogram in one year. Echocardiography is a painless test that uses sound waves to create images of your heart. It provides your doctor with information about the size and shape of your heart and how well your heart's chambers and valves are working. This procedure takes approximately one hour. There are no restrictions for this procedure. Please do NOT wear cologne, perfume, aftershave, or lotions (deodorant is allowed). Please arrive 15 minutes prior to your appointment time.   Your physician has recommend you to have a coronary calcium score in one year . This is a self pay test that will cost $99  Follow-Up: At [facility], you and your health needs are our priority.  As part of our continuing mission to provide you with exceptional heart care, we have created designated Provider Care Teams.  These Care Teams include your primary Cardiologist (physician) and Advanced Practice Providers (APPs -  Physician Assistants and Nurse Practitioners) who all work together to provide you with the care you need, when you need it.  We recommend signing up for the patient portal called "MyChart".  Sign up information is provided on this After Visit Summary.  MyChart is used to connect with patients for Virtual Visits (Telemedicine).  Patients are able to view lab/test results, encounter notes, upcoming appointments, etc.  Non-urgent messages can be sent to your provider as well.   To learn more about what you can do with MyChart, go to [email or web address].    Your next appointment:   Follow up in one year with Dr. Duke Salvia after echo and  calcium score   Other Instructions Please check your blood pressure once daily for two weeks and send [place] that log!

## 2023-01-16 NOTE — Progress Notes (Signed)
Cardiology Office Note:  .    Date:  01/16/2023  ID:  Tommy Hubbard, DOB 28-Sep-1940, MRN [id number] PCP: Tommy Ran, MD  [facility] Providers Cardiologist:  Tommy Si, MD     History of Present Illness: .    Tommy Hubbard is a 82 y.o. male with hypertension, mild-moderate aortic regurgitation, mild aortic aneurysm, and hyperlipidemia here for follow up.  He was initially seen 08/06/18 for the evaluation of chest discomfort.  He reported intermittent episodes of chest discomfort and mild exertional dyspnea.  He was referred for coronary CT-A 08/2018 that revealed a coronary calcium score of 0.  There was mild atherosclerosis of the descending aorta.  There is a mild ascending aorta unchanged from his previous echo.  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  He saw his PCP, Dr. Waynard Hubbard and his blood pressure was noted to be elevated.  He was started on valsartan.  After starting this medication he wore an ambulatory monitor that showed his blood pressure was 118/65 on average.  Dr. Waynard Hubbard noted a murmur on exam.  He had an echo 06/2018 that revealed LVEF 55 to 60% with grade 1 diastolic dysfunction.  He had mild calcification of the aortic valve with mild to moderate regurgitation. Prior to this appointment he had a repeat echocardiogram.    In 09/2020, he had intermittent low blood pressures but was otherwise doing well. He tracked his blood pressure at home and followed up with our pharmacist 10/2020 and his blood pressure was 178/86. His telmisartan was split to BID, and on follow up the next month his blood pressure was 140/76. No changes were made and he planned to increase his exercise.  At his visit 12/2021 blood pressures were better controlled and he was without complaint.  Echo 12/2022 revealed LVEF 55-60% with normal diastolic function.  There is mild aortic regurgitation.  Ascending aorta was 4.0 cm stable  from prior.  Today, he is feeling well. At home his blood pressures are averaging 130s/70s on an Omron cuff, not validated recently. In the office today his reading is 152/80 initially, and 170/84 on manual recheck. He stays active with tennis twice a week, golf once a week, and some swimming. No anginal symptoms, no claudication. We reviewed his recent lab work, revealing improvement in his LDL down to 100, and recent A1c of 5.8. He states that he has been doing well with following a semi-vegetarian diet; meat consumption is rare. He confirms that his T3 and T4 labs were within normal limits when checked by his PCP. He denies any palpitations, chest pain, shortness of breath, peripheral edema, lightheadedness, headaches, syncope, orthopnea, or PND.  ROS:  Please see the history of present illness. All other systems are reviewed and negative.   Studies Reviewed: Marland Kitchen       EKG 01/16/23:  Sinus rhythm.  Rate 62 bpm.  RBBB.  LAFB.  Echo 12/27/22:   1. Left ventricular ejection fraction, by estimation, is 55 to 60%. The  left ventricle has normal function. The left ventricle has no regional  wall motion abnormalities. Left ventricular diastolic parameters were  normal. The average left ventricular  global longitudinal strain is -16.3 %. The global longitudinal strain is  normal.   2. Right ventricular systolic function is normal. The right ventricular  size is normal. There is normal pulmonary artery systolic pressure.   3. The mitral valve is normal in structure. Trivial mitral valve  regurgitation. No evidence of mitral stenosis.   4. The aortic valve is tricuspid. There is mild calcification of the  aortic valve. There is mild thickening of the aortic valve. Aortic valve  regurgitation is mild. No aortic stenosis is present.   5. Dilation stable compared to prior study. Aortic dilatation noted.  There is mild dilatation of the ascending aorta, measuring 40 mm.   6. The inferior vena cava is  normal in size with greater than 50%  respiratory variability, suggesting right atrial pressure of 3 mmHg.   Risk Assessment/Calculations:     HYPERTENSION CONTROL Vitals:   01/16/23 0918 01/16/23 0935 01/16/23 0943  BP: (!) 152/80 (!) 170/84 (!) 148/64    The patient's blood pressure is elevated above target today.  In order to address the patient's elevated BP: The blood pressure is usually elevated in clinic.  Blood pressures monitored at home have been optimal.; Blood pressure will be monitored at home to determine if medication changes need to be made.          Physical Exam:    VS:  BP (!) 148/64 (BP Location: Right Arm, Patient Position: Sitting, Cuff Size: Normal)   Pulse 62   Ht 5\' 9"  (1.753 m)   Wt 160 lb 4.8 oz (72.7 kg)   BMI 23.67 kg/m  , BMI Body mass index is 23.67 kg/m. GENERAL:  Well appearing HEENT: Pupils equal round and reactive, fundi not visualized, oral mucosa unremarkable NECK:  No jugular venous distention, waveform within normal limits, carotid upstroke brisk and symmetric, no bruits, no thyromegaly LUNGS:  Clear to auscultation bilaterally HEART:  RRR.  PMI not displaced or sustained,S1 and S2 within normal limits, no S3, no S4, no clicks, no rubs, I/VI systolic murmur at LUSB ABD:  Flat, positive bowel sounds normal in frequency in pitch, no bruits, no rebound, no guarding, no midline pulsatile mass, no hepatomegaly, no splenomegaly EXT:  2 plus pulses throughout, no edema, no cyanosis no clubbing SKIN:  No rashes no nodules NEURO:  Cranial nerves II through XII grossly intact, motor grossly intact throughout PSYCH:  Cognitively intact, oriented to person place and time  Wt Readings from Last 3 Encounters:  01/16/23 160 lb 4.8 oz (72.7 kg)  12/17/21 161 lb 3.2 oz (73.1 kg)  11/26/20 158 lb 6.4 oz (71.8 kg)     ASSESSMENT AND PLAN: .    # Hypertension: Elevated blood pressure in office, but patient reports lower readings at home. Currently on  Telmisartan 20 mg bid. -Recheck blood pressure after rest with feet on the floor. -Advise patient to monitor blood pressure at home for a couple of weeks and report readings. -Consider adding HCTZ to Telmisartan if home readings are consistently high.  # Hyperlipidemia: Improved LDL and HDL levels, likely due to increased physical activity and dietary changes. Patient is not on statin therapy.  Repeat coronary calcium score next year.  -Continue current lifestyle modifications.  # Prediabetes: Stable HbA1c in the prediabetic range. Patient is maintaining an active lifestyle and watching carbohydrate intake. -Continue current lifestyle modifications.  # Subclinical hypothyroidism: Elevated TSH, but normal T3 and T4 levels. TContinue f/u with Dr. Waynard Hubbard.  # Aortic aneurysm: Stable at 4.0cm on echo 12/2022.   -Plan to repeat echocardiogram in 2025 before next appointment.   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  Mild systolic murmur.  No AS on echo.  Repeat pending for next year.  Dispo:  FU with Mirtha Jain C. Duke Salvia, MD, [facility] in 1 year.  I,Mathew Stumpf,acting as a Neurosurgeon for Tommy Si, MD.,have documented all relevant documentation on the behalf of Tommy Si, MD,as directed by  Tommy Si, MD while in the presence of Tommy Si, MD.  I, Waldron Gerry C. Duke Salvia, MD have reviewed all documentation for this visit.  The documentation of the exam, diagnosis, procedures, and orders on 01/16/2023 are all accurate and complete.   Signed, Tommy Si, MD

## 2023-01-16 NOTE — Progress Notes (Deleted)
Cardiology Office Note:  .   Date:  01/16/2023  ID:  [name], DOB 1941-04-03, MRN [id number] PCP: Rodrigo Ran, MD  [facility] Providers Cardiologist:  None { Click to update primary MD,subspecialty MD or APP then REFRESH:1}   History of Present Illness: .   Tommy Hubbard is a 82 y.o. male with hypertension, mild-moderate aortic regurgitation, mild aortic aneurysm, and hyperlipidemia here for follow up.  He was initially seen 08/06/18 for the evaluation of chest discomfort.  He reported intermittent episodes of chest discomfort and mild exertional dyspnea.  He was referred for coronary CT-A 08/2018 that revealed a coronary calcium score of 0.  There was mild atherosclerosis of the descending aorta.  There is a mild ascending aorta unchanged from his previous echo.  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  He saw his PCP, Dr. Waynard Edwards and his blood pressure was noted to be elevated.  He was started on valsartan.  After starting this medication he wore an ambulatory monitor that showed his blood pressure was 118/65 on average.  Dr. Waynard Edwards noted a murmur on exam.  He had an echo 06/2018 that revealed LVEF 55 to 60% with grade 1 diastolic dysfunction.  He had mild calcification of the aortic valve with mild to moderate regurgitation. Prior to this appointment he had a repeat echocardiogram.    At his last appointment he had intermittent low blood pressures but was otherwise doing well. He tracked his blood pressure at home and followed up with our pharmacist 10/2020 and his blood pressure was 178/86. His telmisartan was split to BID, and on follow up the next month his blood pressure was 140/76. No changes were made and he planned to increase his exercise.  At his visit 12/2021 blood pressures were better controlled and he was without complaint.  Echo 12/2022 revealed LVEF 55-60% with normal diastolic function.  There is mild  aortic regurgitation.  Ascending aorta was 4.0 cm stable from prior.    ROS: ***  Studies Reviewed: .       Echo 12/27/22:     1. Left ventricular ejection fraction, by estimation, is 55 to 60%. The  left ventricle has normal function. The left ventricle has no regional  wall motion abnormalities. Left ventricular diastolic parameters were  normal. The average left ventricular  global longitudinal strain is -16.3 %. The global longitudinal strain is  normal.   2. Right ventricular systolic function is normal. The right ventricular  size is normal. There is normal pulmonary artery systolic pressure.   3. The mitral valve is normal in structure. Trivial mitral valve  regurgitation. No evidence of mitral stenosis.   4. The aortic valve is tricuspid. There is mild calcification of the  aortic valve. There is mild thickening of the aortic valve. Aortic valve  regurgitation is mild. No aortic stenosis is present.   5. Dilation stable compared to prior study. Aortic dilatation noted.  There is mild dilatation of the ascending aorta, measuring 40 mm.   6. The inferior vena cava is normal in size with greater than 50%  respiratory variability, suggesting right atrial pressure of 3 mmHg.    Risk Assessment/Calculations:   {Does this patient have ATRIAL FIBRILLATION?:[phone]} No BP recorded.  {Refresh Note OR Click here to enter BP  :1}***       Physical Exam:   VS:  There were no vitals taken for this visit.   Wt Readings  from Last 3 Encounters:  12/17/21 161 lb 3.2 oz (73.1 kg)  11/26/20 158 lb 6.4 oz (71.8 kg)  10/29/20 161 lb 12.8 oz (73.4 kg)    GEN: Well nourished, well developed in no acute distress NECK: No JVD; No carotid bruits CARDIAC: ***RRR, no murmurs, rubs, gallops RESPIRATORY:  Clear to auscultation without rales, wheezing or rhonchi  ABDOMEN: Soft, non-tender, non-distended EXTREMITIES:  No edema; No deformity   ASSESSMENT AND PLAN: .   ***    {Are you  ordering a CV Procedure (e.g. stress test, cath, DCCV, TEE, etc)?   Press F2        :[id number]}  Dispo: ***  Signed, Chilton Si, MD

## 2023-03-14 DIAGNOSIS — Z961 Presence of intraocular lens: Secondary | ICD-10-CM | POA: Diagnosis not present

## 2023-04-08 DIAGNOSIS — Z23 Encounter for immunization: Secondary | ICD-10-CM | POA: Diagnosis not present

## 2023-05-08 DIAGNOSIS — R69 Illness, unspecified: Secondary | ICD-10-CM | POA: Diagnosis not present

## 2023-05-24 DIAGNOSIS — R69 Illness, unspecified: Secondary | ICD-10-CM | POA: Diagnosis not present

## 2023-07-06 DIAGNOSIS — H5213 Myopia, bilateral: Secondary | ICD-10-CM | POA: Diagnosis not present

## 2023-08-07 DIAGNOSIS — H6123 Impacted cerumen, bilateral: Secondary | ICD-10-CM | POA: Diagnosis not present

## 2023-11-01 DIAGNOSIS — E785 Hyperlipidemia, unspecified: Secondary | ICD-10-CM | POA: Diagnosis not present

## 2023-11-01 DIAGNOSIS — R785 Finding of other psychotropic drug in blood: Secondary | ICD-10-CM | POA: Diagnosis not present

## 2023-11-01 LAB — LAB REPORT - SCANNED: EGFR: 80.8

## 2023-11-02 DIAGNOSIS — Z85828 Personal history of other malignant neoplasm of skin: Secondary | ICD-10-CM | POA: Diagnosis not present

## 2023-11-02 DIAGNOSIS — C44319 Basal cell carcinoma of skin of other parts of face: Secondary | ICD-10-CM | POA: Diagnosis not present

## 2023-11-02 DIAGNOSIS — D2271 Melanocytic nevi of right lower limb, including hip: Secondary | ICD-10-CM | POA: Diagnosis not present

## 2023-11-02 DIAGNOSIS — D1801 Hemangioma of skin and subcutaneous tissue: Secondary | ICD-10-CM | POA: Diagnosis not present

## 2023-11-02 DIAGNOSIS — D1721 Benign lipomatous neoplasm of skin and subcutaneous tissue of right arm: Secondary | ICD-10-CM | POA: Diagnosis not present

## 2023-11-02 DIAGNOSIS — L820 Inflamed seborrheic keratosis: Secondary | ICD-10-CM | POA: Diagnosis not present

## 2023-11-02 DIAGNOSIS — L570 Actinic keratosis: Secondary | ICD-10-CM | POA: Diagnosis not present

## 2023-11-02 DIAGNOSIS — C44311 Basal cell carcinoma of skin of nose: Secondary | ICD-10-CM | POA: Diagnosis not present

## 2023-11-02 DIAGNOSIS — D224 Melanocytic nevi of scalp and neck: Secondary | ICD-10-CM | POA: Diagnosis not present

## 2023-11-06 DIAGNOSIS — R011 Cardiac murmur, unspecified: Secondary | ICD-10-CM | POA: Diagnosis not present

## 2023-11-06 DIAGNOSIS — Z1331 Encounter for screening for depression: Secondary | ICD-10-CM | POA: Diagnosis not present

## 2023-11-06 DIAGNOSIS — J3800 Paralysis of vocal cords and larynx, unspecified: Secondary | ICD-10-CM | POA: Diagnosis not present

## 2023-11-06 DIAGNOSIS — I10 Essential (primary) hypertension: Secondary | ICD-10-CM | POA: Diagnosis not present

## 2023-11-06 DIAGNOSIS — C4491 Basal cell carcinoma of skin, unspecified: Secondary | ICD-10-CM | POA: Diagnosis not present

## 2023-11-06 DIAGNOSIS — N529 Male erectile dysfunction, unspecified: Secondary | ICD-10-CM | POA: Diagnosis not present

## 2023-11-06 DIAGNOSIS — Z0000 Encounter for general adult medical examination without abnormal findings: Secondary | ICD-10-CM | POA: Diagnosis not present

## 2023-11-06 DIAGNOSIS — E785 Hyperlipidemia, unspecified: Secondary | ICD-10-CM | POA: Diagnosis not present

## 2023-11-06 DIAGNOSIS — D126 Benign neoplasm of colon, unspecified: Secondary | ICD-10-CM | POA: Diagnosis not present

## 2023-11-06 DIAGNOSIS — M8580 Other specified disorders of bone density and structure, unspecified site: Secondary | ICD-10-CM | POA: Diagnosis not present

## 2023-11-06 DIAGNOSIS — R82998 Other abnormal findings in urine: Secondary | ICD-10-CM | POA: Diagnosis not present

## 2023-11-06 DIAGNOSIS — I7090 Unspecified atherosclerosis: Secondary | ICD-10-CM | POA: Diagnosis not present

## 2023-11-06 DIAGNOSIS — N401 Enlarged prostate with lower urinary tract symptoms: Secondary | ICD-10-CM | POA: Diagnosis not present

## 2023-12-19 DIAGNOSIS — C44311 Basal cell carcinoma of skin of nose: Secondary | ICD-10-CM | POA: Diagnosis not present

## 2023-12-19 DIAGNOSIS — C44319 Basal cell carcinoma of skin of other parts of face: Secondary | ICD-10-CM | POA: Diagnosis not present

## 2024-01-04 DIAGNOSIS — N138 Other obstructive and reflux uropathy: Secondary | ICD-10-CM | POA: Diagnosis not present

## 2024-01-04 DIAGNOSIS — N401 Enlarged prostate with lower urinary tract symptoms: Secondary | ICD-10-CM | POA: Diagnosis not present

## 2024-01-04 DIAGNOSIS — N529 Male erectile dysfunction, unspecified: Secondary | ICD-10-CM | POA: Diagnosis not present

## 2024-01-10 ENCOUNTER — Ambulatory Visit (HOSPITAL_BASED_OUTPATIENT_CLINIC_OR_DEPARTMENT_OTHER): Payer: Medicare HMO

## 2024-01-17 ENCOUNTER — Ambulatory Visit (HOSPITAL_BASED_OUTPATIENT_CLINIC_OR_DEPARTMENT_OTHER): Payer: Medicare HMO

## 2024-01-17 ENCOUNTER — Ambulatory Visit (HOSPITAL_BASED_OUTPATIENT_CLINIC_OR_DEPARTMENT_OTHER)
Admission: RE | Admit: 2024-01-17 | Discharge: 2024-01-17 | Disposition: A | Discharge status: Home / Self Care | Payer: Medicare HMO | Source: Ambulatory Visit | Attending: Cardiovascular Disease | Admitting: Cardiovascular Disease

## 2024-01-17 DIAGNOSIS — I7121 Aneurysm of the ascending aorta, without rupture: Secondary | ICD-10-CM

## 2024-01-17 DIAGNOSIS — I10 Essential (primary) hypertension: Secondary | ICD-10-CM | POA: Diagnosis not present

## 2024-01-17 DIAGNOSIS — I5030 Unspecified diastolic (congestive) heart failure: Secondary | ICD-10-CM | POA: Diagnosis not present

## 2024-01-17 DIAGNOSIS — I77810 Thoracic aortic ectasia: Secondary | ICD-10-CM | POA: Diagnosis not present

## 2024-01-17 DIAGNOSIS — E7800 Pure hypercholesterolemia, unspecified: Secondary | ICD-10-CM | POA: Diagnosis not present

## 2024-01-17 DIAGNOSIS — I351 Nonrheumatic aortic (valve) insufficiency: Secondary | ICD-10-CM | POA: Diagnosis not present

## 2024-01-17 LAB — ECHOCARDIOGRAM COMPLETE
AV Vena cont: 0.52 cm
Area-P 1/2: 2.54 cm2
P 1/2 time: 577 ms
S' Lateral: 3.69 cm

## 2024-01-21 ENCOUNTER — Ambulatory Visit: Payer: Self-pay | Admitting: Cardiovascular Disease

## 2024-01-24 ENCOUNTER — Encounter (HOSPITAL_BASED_OUTPATIENT_CLINIC_OR_DEPARTMENT_OTHER): Payer: Self-pay | Admitting: Cardiovascular Disease

## 2024-01-24 ENCOUNTER — Ambulatory Visit (HOSPITAL_BASED_OUTPATIENT_CLINIC_OR_DEPARTMENT_OTHER): Admitting: Cardiovascular Disease

## 2024-01-24 ENCOUNTER — Telehealth (HOSPITAL_BASED_OUTPATIENT_CLINIC_OR_DEPARTMENT_OTHER): Payer: Self-pay | Admitting: *Deleted

## 2024-01-24 VITALS — BP 162/90 | HR 85 | Ht 69.0 in | Wt 156.0 lb

## 2024-01-24 DIAGNOSIS — E7800 Pure hypercholesterolemia, unspecified: Secondary | ICD-10-CM | POA: Diagnosis not present

## 2024-01-24 DIAGNOSIS — I7121 Aneurysm of the ascending aorta, without rupture: Secondary | ICD-10-CM

## 2024-01-24 DIAGNOSIS — I351 Nonrheumatic aortic (valve) insufficiency: Secondary | ICD-10-CM

## 2024-01-24 DIAGNOSIS — I10 Essential (primary) hypertension: Secondary | ICD-10-CM | POA: Diagnosis not present

## 2024-01-24 DIAGNOSIS — Z5181 Encounter for therapeutic drug level monitoring: Secondary | ICD-10-CM | POA: Diagnosis not present

## 2024-01-24 MED ORDER — TELMISARTAN 40 MG PO TABS: ORAL_TABLET | ORAL | 3 refills | Status: DC

## 2024-01-24 NOTE — Patient Instructions (Signed)
 Medication Instructions:  INCREASE YOU TELMISARTAN  TO FULL TABLET IN THE MORNING AND 1/2 TABLET IN THE EVENING.   *If you need a refill on your cardiac medications before your next appointment, please call your pharmacy*  Lab Work: BMET IN 1 WEEK   If you have labs (blood work) drawn today and your tests are completely normal, you will receive your results only by: MyChart Message (if you have MyChart) OR A paper copy in the mail If you have any lab test that is abnormal or we need to change your treatment, we will call you to review the results.  Testing/Procedures: Your physician has requested that you have an echocardiogram. Echocardiography is a painless test that uses sound waves to create images of your heart. It provides your doctor with information about the size and shape of your heart and how well your heart's chambers and valves are working. This procedure takes approximately one hour. There are no restrictions for this procedure. Please do NOT wear cologne, perfume, aftershave, or lotions (deodorant is allowed). Please arrive 15 minutes prior to your appointment time.  Please note: We ask at that you not bring children with you during ultrasound (echo/ vascular) testing. Due to room size and safety concerns, children are not allowed in the ultrasound rooms during exams. Our front office staff cannot provide observation of children in our lobby area while testing is being conducted. An adult accompanying a patient to their appointment will only be allowed in the ultrasound room at the discretion of the ultrasound technician under special circumstances. We apologize for any inconvenience.  TO BE DONE IN 6 TO 9 MONTHS  Follow-Up: At [facility], you and your health needs are our priority.  As part of our continuing mission to provide you with exceptional heart care, our providers are all part of one team.  This team includes your primary Cardiologist (physician) and Advanced  Practice Providers or APPs (Physician Assistants and Nurse Practitioners) who all work together to provide you with the care you need, when you need it.  Your next appointment:   12 month(s)  Provider:   Annabella Scarce, MD, Rosaline Bane, NP, or Reche Finder, NP    We recommend signing up for the patient portal called MyChart.  Sign up information is provided on this After Visit Summary.  MyChart is used to connect with patients for Virtual Visits (Telemedicine).  Patients are able to view lab/test results, encounter notes, upcoming appointments, etc.  Non-urgent messages can be sent to your provider as well.   To learn more about what you can do with MyChart, go to [email or web address].   Other Instructions MONITOR BLOOD PRESSURE DAILY FOR 2 WEEKS, SEND MYCHART MESSAGE WITH YOUR READINGS

## 2024-01-24 NOTE — Telephone Encounter (Signed)
 Patient seen in office today by Dr Raford  Telmisartan  increased to 40 mg in the morning and 1/2 tablet in the evening, only wants to cover 1 tablet per day  Insurance requiring a PA, will forward to PA team for review

## 2024-01-24 NOTE — Progress Notes (Signed)
 12

## 2024-01-24 NOTE — Progress Notes (Signed)
 Sign suppressed Cardiology Office Note:  .   Date:  01/24/2024  ID:  Tommy Hubbard, DOB Apr 21, 1941, MRN [id number] PCP: Tommy Anes, MD  [facility] Providers Cardiologist:  Annabella Scarce, MD    History of Present Illness: .    Tommy Hubbard is a 83 y.o. male with hypertension, mild-moderate aortic regurgitation, mild aortic aneurysm, prediabetes, and hyperlipidemia here for follow up.  He was initially seen 08/06/18 for the evaluation of chest discomfort.  He reported intermittent episodes of chest discomfort and mild exertional dyspnea.  He was referred for coronary CT-A 08/2018 that revealed a coronary calcium score of 0.  There was mild atherosclerosis of the descending aorta.  There is a mild ascending aorta unchanged from his previous echo.  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  He saw his PCP, Dr. Shayne and his blood pressure was noted to be elevated.  He was started on valsartan.  After starting this medication he wore an ambulatory monitor that showed his blood pressure was 118/65 on average.  Dr. Shayne noted a murmur on exam.  He had an echo 06/2018 that revealed LVEF 55 to 60% with grade 1 diastolic dysfunction.  He had mild calcification of the aortic valve with mild to moderate regurgitation. Prior to this appointment he had a repeat echocardiogram.    In 09/2020, he had intermittent low blood pressures but was otherwise doing well. He tracked his blood pressure at home and followed up with our pharmacist 10/2020 and his blood pressure was 178/86. His telmisartan  was split to BID, and on follow up the next month his blood pressure was 140/76. No changes were made and he planned to increase his exercise.  At his visit 12/2021 blood pressures were better controlled and he was without complaint.  Echo 12/2022 revealed LVEF 55-60% with normal diastolic function.  There is mild aortic regurgitation.  Ascending  aorta was 4.0 cm stable from prior.  At his appointment 01/2023 he was feeling well.  Blood pressure was averaging in the 130s over 70s.  It was higher in the office.  He was asked to keep tracking his blood pressures at home and consider adding HCTZ if consistently over 130/80.  He was exercising regularly and had no symptoms.  Calcium score 01/2024 revealed a calcium score of 11.9 which was 2nd percentile for age and gender.  There was aortic atherosclerosis.  The ascending aorta was 4.2 cm.  Repeat echocardiogram revealed LVEF 50-55% with grade 1 diastolic dysfunction.  There is aortic valve sclerosis without stenosis and mild mitral regurgitation.  Ascending aorta was 4.0 cm.    Discussed the use of AI scribe software for clinical note transcription with the patient, who gave verbal consent to proceed.  History of Present Illness Dr. Gallop maintains an active lifestyle, engaging in tennis twice a week, swimming, and playing golf once or twice a week, without experiencing chest pain, shortness of breath, or swelling during these activities.  His home blood pressure readings have been slightly elevated initially but tend to decrease over time, with the most recent reading at 144/75-80 mmHg. He is currently taking telmisartan , half a tablet in the morning and half at night, without any side effects.  His calcium score was reported as showing minimal plaque. His LDL levels have increased from 87 to 107 mg/dL.  He reports sleeping well and does not have any additional concerns at this time. No chest pain, shortness of  breath, or swelling.   ROS:  As per HPI  Studies Reviewed: SABRA   EKG Interpretation Date/Time:  Wednesday January 24 2024 08:02:25 EDT Ventricular Rate:  64 PR Interval:  174 QRS Duration:  142 QT Interval:  452 QTC Calculation: 466 R Axis:   -46  Text Interpretation: Normal sinus rhythm Right bundle branch block Left anterior fascicular block Bifascicular block When compared  with ECG of 16-Jan-2023 09:20, No significant change was found Confirmed by Raford Riggs ([id number]) on 01/24/2024 8:05:42 AM   Coronary calcium score 01/17/2024: IMPRESSION: 1. Coronary calcium score of 11.9. This was 2nd percentile for age-, race-, and sex-matched controls.   2. Aortic atherosclerosis.   3. Evidence of ascending aortic dilation, 41.57mm, on non-contrasted study at the level of the right pulmonary artery. Recommend annual imaging followup by CTA or MRA. This recommendation follows 2010 ACCF/AHA/AATS/ACR/ASA/SCA/SCAI/SIR/STS/SVM Guidelines for the Diagnosis and Management of Patients with Thoracic Aortic Disease. Circulation. 2010; 121: Z733-z630.   4. Non-cardiac: See separate report from [facility].  Echo 01/17/2024:  1. Left ventricular ejection fraction, by estimation, is 50 to 55%. Left  ventricular ejection fraction by 3D volume is 53 %. The left ventricle has  low normal function. The left ventricle has no regional wall motion  abnormalities. Left ventricular  diastolic parameters are consistent with Grade I diastolic dysfunction  (impaired relaxation).   2. Right ventricular systolic function was not well visualized. The right  ventricular size is normal. Tricuspid regurgitation signal is inadequate  for assessing PA pressure.   3. The mitral valve is normal in structure. Trivial mitral valve  regurgitation. No evidence of mitral stenosis.   4. The aortic valve is tricuspid. There is mild calcification of the  aortic valve. There is mild thickening of the aortic valve. Aortic valve  regurgitation is mild. Aortic valve sclerosis/calcification is present,  without any evidence of aortic  stenosis. Aortic regurgitation PHT measures 577 msec.   5. Aortic dilatation noted. There is mild dilatation of the ascending  aorta, measuring 40 mm.   6. The inferior vena cava is normal in size with greater than 50%  respiratory variability, suggesting right  atrial pressure of 3 mmHg.     Risk Assessment/Calculations:     HYPERTENSION CONTROL Vitals:   01/24/24 0757 01/24/24 0817  BP: (!) 164/72 (!) 162/90    The patient's blood pressure is elevated above target today.  In order to address the patient's elevated BP: A current anti-hypertensive medication was adjusted today.          Physical Exam:   VS:  BP (!) 162/90   Pulse 85   Ht 5' 9 (1.753 m)   Wt 156 lb (70.8 kg)   SpO2 98%   BMI 23.04 kg/m  , BMI Body mass index is 23.04 kg/m. GENERAL:  Well appearing HEENT: Pupils equal round and reactive, fundi not visualized, oral mucosa unremarkable NECK:  No jugular venous distention, waveform within normal limits, carotid upstroke brisk and symmetric, no bruits, no thyromegaly LUNGS:  Clear to auscultation bilaterally HEART:  RRR.  PMI not displaced or sustained,S1 and S2 within normal limits, no S3, no S4, no clicks, no rubs, I/VI systolic murmur at the LUSB ABD:  Flat, positive bowel sounds normal in frequency in pitch, no bruits, no rebound, no guarding, no midline pulsatile mass, no hepatomegaly, no splenomegaly EXT:  2 plus pulses throughout, no edema, no cyanosis no clubbing SKIN:  No rashes no nodules NEURO:  Cranial nerves II  through XII grossly intact, motor grossly intact throughout [facility]:  Cognitively intact, oriented to person place and time   ASSESSMENT AND PLAN: .    Assessment & Plan # Hypertension Blood pressure elevated at home and in-office.  BP goal is <130/80.   Managed with telmisartan , no side effects. - Increase telmisartan  to one full tablet in the morning and half at night. - Monitor blood pressure twice daily for two weeks. - Repeat basic metabolic panel in one week to monitor potassium levels. - Send blood pressure readings via MyChart message.  # Aortic valve regurgitation Mild regurgitation, stable echocardiogram, no heart failure symptoms. - Repeat echocardiogram in 6 to 9 months to monitor  stability.  # Reduced LVEF:  Ejection fraction low normal, slightly decreased from prior which was 55-60%.  Clinically he is stable, no heart failure symptoms. - Repeat echocardiogram in 6 to 9 months to monitor ejection fraction.  # Non-obstructive Coronary artery disease Minimal plaque, low calcium score, which is only 2nd percentile for age/gender.  LDL increased from prior but still pretty good. Focus on lifestyle modifications. - Encourage dietary modifications to reduce LDL cholesterol towards 70-80 mg/dL. - Continue regular exercise.        Dispo: f/u in 1 year  Signed, Annabella Scarce, MD

## 2024-01-25 ENCOUNTER — Other Ambulatory Visit (HOSPITAL_COMMUNITY): Payer: Self-pay

## 2024-01-25 ENCOUNTER — Telehealth: Payer: Self-pay | Admitting: Pharmacy Technician

## 2024-01-25 NOTE — Telephone Encounter (Signed)
 Pharmacy Patient Advocate Encounter  Received notification from [employer] that Prior Authorization for Telmisartan  has been APPROVED from 07/05/23 to 07/03/24. Ran test claim, Copay is $6.00- 3 months. This test claim was processed through [facility]- copay amounts may vary at other pharmacies due to pharmacy/plan contracts, or as the patient moves through the different stages of their insurance plan.   PA #/Case ID/Reference #: [id number]

## 2024-01-25 NOTE — Telephone Encounter (Signed)
 Pharmacy Patient Advocate Encounter   Received notification from Pt Calls Messages [facility]) that prior authorization for Telmisartan  40MG  is required/requested.   Insurance verification completed.   The patient is insured through [employer] .   Per test claim: PA required; PA submitted to above mentioned insurance via CoverMyMeds Key/confirmation #/EOC [id number] Status is pending

## 2024-01-25 NOTE — Telephone Encounter (Signed)
 Lvm pt prior auth went through.

## 2024-01-31 NOTE — Telephone Encounter (Signed)
See phone note 7/24

## 2024-02-12 DIAGNOSIS — Z85828 Personal history of other malignant neoplasm of skin: Secondary | ICD-10-CM | POA: Diagnosis not present

## 2024-02-12 DIAGNOSIS — D2271 Melanocytic nevi of right lower limb, including hip: Secondary | ICD-10-CM | POA: Diagnosis not present

## 2024-02-15 DIAGNOSIS — D485 Neoplasm of uncertain behavior of skin: Secondary | ICD-10-CM | POA: Diagnosis not present

## 2024-02-15 DIAGNOSIS — D2271 Melanocytic nevi of right lower limb, including hip: Secondary | ICD-10-CM | POA: Diagnosis not present

## 2024-02-22 ENCOUNTER — Telehealth (HOSPITAL_BASED_OUTPATIENT_CLINIC_OR_DEPARTMENT_OTHER): Payer: Self-pay | Admitting: *Deleted

## 2024-02-22 DIAGNOSIS — Z85828 Personal history of other malignant neoplasm of skin: Secondary | ICD-10-CM | POA: Diagnosis not present

## 2024-02-22 DIAGNOSIS — L237 Allergic contact dermatitis due to plants, except food: Secondary | ICD-10-CM | POA: Diagnosis not present

## 2024-02-22 MED ORDER — TELMISARTAN 40 MG PO TABS: ORAL_TABLET | ORAL | Status: AC

## 2024-02-22 NOTE — Telephone Encounter (Signed)
 Patient reached out stating that he did not return for BMET secondary to issues with increased Telmisartan .  When he increased dose to 40 mg in the morning and 20 mg in the evening  blood pressure was dropping tool low, 100/60 and was feeling lightheaded frequently.  He resumed the Telmisartan  20 mg twice a day, blood pressure staying good  Will forward to Dr Raford for review

## 2024-02-23 NOTE — Telephone Encounter (Signed)
 Tommy Riggs, MD to Me    02/23/24 12:11 PM OK that makes sense.  Thanks!   TCR

## 2024-04-20 DIAGNOSIS — Z23 Encounter for immunization: Secondary | ICD-10-CM | POA: Diagnosis not present

## 2024-05-01 DIAGNOSIS — I10 Essential (primary) hypertension: Secondary | ICD-10-CM | POA: Diagnosis not present

## 2024-05-01 DIAGNOSIS — Z5181 Encounter for therapeutic drug level monitoring: Secondary | ICD-10-CM | POA: Diagnosis not present

## 2024-05-01 LAB — BASIC METABOLIC PANEL WITH GFR
BUN/Creatinine Ratio: 15 (ref 10–24)
BUN: 16 mg/dL (ref 8–27)
CO2: 23 mmol/L (ref 20–29)
Calcium: 9.2 mg/dL (ref 8.6–10.2)
Chloride: 104 mmol/L (ref 96–106)
Creatinine, Ser: 1.08 mg/dL (ref 0.76–1.27)
Glucose: 56 mg/dL — ABNORMAL LOW (ref 70–99)
Potassium: 4.9 mmol/L (ref 3.5–5.2)
Sodium: 139 mmol/L (ref 134–144)
eGFR: 68 mL/min/1.73 (ref >59)

## 2024-05-03 NOTE — Progress Notes (Addendum)
 Tommy Hubbard                                          MRN: [id number]   07/15/2024   The [facility] Quality Team Specialist reviewed this patient medical record for the purposes of chart review for care gap closure. The following were reviewed: chart review for care gap closure-controlling blood pressure.    VBCI Quality Team

## 2024-07-31 ENCOUNTER — Other Ambulatory Visit (HOSPITAL_BASED_OUTPATIENT_CLINIC_OR_DEPARTMENT_OTHER)

## 2024-08-01 ENCOUNTER — Ambulatory Visit (INDEPENDENT_AMBULATORY_CARE_PROVIDER_SITE_OTHER)

## 2024-08-01 DIAGNOSIS — I351 Nonrheumatic aortic (valve) insufficiency: Secondary | ICD-10-CM

## 2024-08-01 DIAGNOSIS — I10 Essential (primary) hypertension: Secondary | ICD-10-CM | POA: Diagnosis not present

## 2024-08-01 DIAGNOSIS — I7121 Aneurysm of the ascending aorta, without rupture: Secondary | ICD-10-CM | POA: Diagnosis not present

## 2024-08-01 LAB — ECHOCARDIOGRAM COMPLETE
AR max vel: 2.94 cm2
AV Area VTI: 2.9 cm2
AV Area mean vel: 2.56 cm2
AV Mean grad: 4 mmHg
AV Peak grad: 7.5 mmHg
AV Vena cont: 0.42 cm
Ao pk vel: 1.37 m/s
Area-P 1/2: 3.1 cm2
P 1/2 time: 451 ms
S' Lateral: 2.89 cm

## 2024-08-03 ENCOUNTER — Ambulatory Visit: Payer: Self-pay | Admitting: Cardiovascular Disease
